# Patient Record
Sex: Female | Born: 1965 | Race: Black or African American | Hispanic: No | Smoking: Never smoker
Health system: Southern US, Community
[De-identification: ages and names within clinical notes are randomized; demographics above are authoritative.]

## PROBLEM LIST (undated history)

## (undated) DIAGNOSIS — E785 Hyperlipidemia, unspecified: Secondary | ICD-10-CM

## (undated) DIAGNOSIS — F419 Anxiety disorder, unspecified: Secondary | ICD-10-CM

## (undated) DIAGNOSIS — I10 Essential (primary) hypertension: Secondary | ICD-10-CM

## (undated) HISTORY — DX: Hyperlipidemia, unspecified: E78.5

## (undated) HISTORY — PX: TUBAL LIGATION: SHX77

## (undated) HISTORY — PX: ECTOPIC PREGNANCY SURGERY: SHX613

## (undated) HISTORY — DX: Essential (primary) hypertension: I10

## (undated) HISTORY — DX: Anxiety disorder, unspecified: F41.9

## (undated) HISTORY — PX: ENDOMETRIAL ABLATION: SHX621

---

## 2000-09-30 ENCOUNTER — Other Ambulatory Visit: Admission: RE | Admit: 2000-09-30 | Discharge: 2000-09-30 | Payer: Self-pay | Admitting: Obstetrics and Gynecology

## 2001-07-28 ENCOUNTER — Ambulatory Visit (HOSPITAL_COMMUNITY): Admission: RE | Admit: 2001-07-28 | Discharge: 2001-07-28 | Payer: Self-pay | Admitting: Obstetrics and Gynecology

## 2001-07-28 ENCOUNTER — Encounter: Payer: Self-pay | Admitting: Obstetrics and Gynecology

## 2004-08-23 ENCOUNTER — Ambulatory Visit (HOSPITAL_COMMUNITY): Admission: RE | Admit: 2004-08-23 | Discharge: 2004-08-23 | Payer: Self-pay | Admitting: Obstetrics and Gynecology

## 2006-12-27 ENCOUNTER — Ambulatory Visit (HOSPITAL_COMMUNITY): Admission: RE | Admit: 2006-12-27 | Discharge: 2006-12-27 | Payer: Self-pay | Admitting: Obstetrics and Gynecology

## 2007-02-13 ENCOUNTER — Ambulatory Visit (HOSPITAL_COMMUNITY): Admission: RE | Admit: 2007-02-13 | Discharge: 2007-02-13 | Payer: Self-pay | Admitting: Obstetrics and Gynecology

## 2008-03-08 ENCOUNTER — Other Ambulatory Visit: Admission: RE | Admit: 2008-03-08 | Discharge: 2008-03-08 | Payer: Self-pay | Admitting: Adult Health

## 2008-08-25 ENCOUNTER — Ambulatory Visit (HOSPITAL_COMMUNITY): Admission: RE | Admit: 2008-08-25 | Discharge: 2008-08-25 | Payer: Self-pay | Admitting: Family Medicine

## 2009-03-28 ENCOUNTER — Other Ambulatory Visit: Admission: RE | Admit: 2009-03-28 | Discharge: 2009-03-28 | Payer: Self-pay | Admitting: Obstetrics and Gynecology

## 2009-04-01 ENCOUNTER — Emergency Department (HOSPITAL_COMMUNITY): Admission: EM | Admit: 2009-04-01 | Discharge: 2009-04-01 | Payer: Self-pay | Admitting: Emergency Medicine

## 2009-04-01 ENCOUNTER — Ambulatory Visit (HOSPITAL_COMMUNITY): Admission: RE | Admit: 2009-04-01 | Discharge: 2009-04-01 | Payer: Self-pay | Admitting: Obstetrics & Gynecology

## 2010-08-30 ENCOUNTER — Other Ambulatory Visit (HOSPITAL_COMMUNITY)
Admission: RE | Admit: 2010-08-30 | Discharge: 2010-08-30 | Disposition: A | Discharge status: Home / Self Care | Payer: 59 | Source: Ambulatory Visit | Attending: Obstetrics and Gynecology | Admitting: Obstetrics and Gynecology

## 2010-08-30 DIAGNOSIS — Z01419 Encounter for gynecological examination (general) (routine) without abnormal findings: Secondary | ICD-10-CM | POA: Insufficient documentation

## 2010-10-10 NOTE — H&P (Signed)
NAME:  Shannon Ashley, Shannon Ashley                 ACCOUNT NO.:  [id number]   MEDICAL RECORD NO.:  [id number]          PATIENT TYPE:  AMB   LOCATION:  DAY                           FACILITY:  [facility]   PHYSICIAN:  Tilda Burrow, M.D. DATE OF BIRTH:  10/08/1965   DATE OF ADMISSION:  02/13/2007  DATE OF DISCHARGE:  LH                              HISTORY & PHYSICAL   ADMISSION DIAGNOSES:  1. Desire for elective permanent sterilization.  2. Menorrhagia and dysmenorrhea.   HISTORY OF PRESENT ILLNESS:  This 45 year old female is admitted at this  time for elective permanent sterilization and endometrial ablation to  address her desire to have permanent sterilization performed so she can  get off of her birth control pills.  She IUD removed Sep 26, 2006, and  was placed on Loestrin 24 and is on her 3rd pack.  She has been having  cramps.  Her periods are light but somewhat irregular for the past three  to four months.  She was taken off the birth control pills in July due  to some question about headache and chest discomfort of a nonspecific  nature which had a negative workup but led to concerns.  She desires no  further childbearing.  Additionally, the menstrual discomforts have been  sufficient enough that she desires endometrial ablation to be performed  at the time of tubal sterilization.  She has had review of the procedure  by Cyril Mourning, nurse practitioner in our office, and gas received  the company's ablation brochures and questions were answered to the  patient's satisfaction.   PAST MEDICAL HISTORY:  1. Elevated lipids.  2. Mild hypertension.  3. Recently, she has had evaluation for left ovarian cyst.  4. She was described by a recent urologist as having a      cystoenterocele, but with her exam today seems to have adequate      pelvic support at the present time.  5. She has had multiple normal Pap smears, the most recently normal      09/2006.   PHYSICAL EXAMINATION:  VITAL  SIGNS:  Height 5 feet 6 inches, weight 210,  blood pressure 120/80.  HEENT:  Pupils equal and reactive.  NECK:  Supple.  CARDIOVASCULAR:  Unremarkable.  ABDOMEN:  Nontender.  EXTERNAL GENITALIA:  Multiparous.  VAGINAL EXAM:  Normal secretions.  Normal supported cervix.  Uterus  retroverted, retroflexed.  Adnexa nontender without masses.   ASSESSMENT:  1. Elective sterilization.  2. Menorrhagia.   PLAN:  Laparoscopic tubal sterilization with Falope rings at 9 o'clock  on September 18, with hysteroscopy D&C and endometrial ablation under  the same anesthesia.      Tilda Burrow, M.D.  Electronically Signed     JVF/MEDQ  D:  02/05/2007  T:  02/06/2007  Job:  [id number]   cc:   [facility]

## 2010-10-10 NOTE — Op Note (Signed)
NAME:  Shannon Ashley, Shannon Ashley                 ACCOUNT NO.:  [id number]   MEDICAL RECORD NO.:  [id number]          PATIENT TYPE:  AMB   LOCATION:  DAY                           FACILITY:  [facility]   PHYSICIAN:  Tilda Burrow, M.D. DATE OF BIRTH:  03-08-1966   DATE OF PROCEDURE:  02/13/2007  DATE OF DISCHARGE:                               OPERATIVE REPORT   PREOPERATIVE DIAGNOSES:  1. Menorrhagia.  2. Elective sterilization.   POSTOPERATIVE DIAGNOSES:  1. Menorrhagia.  2. Elective sterilization.   PROCEDURE:  1. Laparoscopic tubal sterilization Falope ring application.  2. Hysteroscopy, D&C, endometrial ablation.   SURGEON:  Dr. Emelda Fear   ASSISTANT:  None.   ANESTHESIA:  General.   COMPLICATIONS:  None.   FINDINGS:  Normal uterus, tubes, and ovaries.  Small area of ascending  colon adhesions to anterior abdominal wall, longstanding and left alone.  Retroverted, retroflexed uterus.   DETAILS OF PROCEDURE:  The patient was taken to the operating room,  prepped and  draped for a vaginal and abdominal combined procedure.  An  infraumbilical vertical skin incision was made as well as a suprapubic  incision. Veress needle was used to introduce pneumoperitoneum under 9  mmHg pressure. Laparoscopic trocar was introduced directly, and  inspection of the abdomen revealed no evidence of trauma to the internal  organs.  The bladder was full but not an obstacle to progress.  Uterus  could be anteflexed with the Hulka manipulator which was in place in the  uterus, and each tube could be grasped, elevated, and Falope ring  applied to the mid segment knuckle of each tube.  The tube was then  infiltrated with Marcaine solution to achieve long-term analgesia during  the necrosis process.  Inspection of the upper abdomen showed some  adhesions to the right lower quadrant, but these did appear to be under  tension and were left alone.  The appendiceal tip could be visualized  and appeared normal.   The abdomen was deflated, saline solution instilled, the laparoscopic  equipment removed.  Subcuticular 3-0 Vicryl used to close the skin  incisions and Steri-Strips and Band-Aids placed over the abdominal  incisions.  Sponge and needle counts correct.   Hysteroscopy, D&C, endometrial ablation followed.   The speculum was inserted, cervix grasped with single-tooth tenaculum,  and the uterus sounded to 8 cm in the retroverted, retroflexed position  and the uterus dilated to 23 [place], allowing introduction of a rigid 30-  degree hysteroscope to identify the uterine contours.  There was a  minimal amount of shaggy debris inside the uterus, and this was  curetted.  There was not enough removable to warrant specimen.  No  pathologic abnormalities were suspected.  Tubal ostia were visualized.  The Gynecare ThermaChoice 3 endometrial ablation device was inserted,  instilled with 8 mL with saline in order to achieve pressures greater  than 150 mmHg whereupon the 8 minute thermal ablation sequence was  initiated and completed with the temperature of 87 degrees CGy achieved  and pressure maintained between 150 and 165 mmHg.  All the saline was  removed at the completion of the ablation sequence and hysteroscopy  repeated to confirm satisfactory thermal changes in the endometrial  cavity.  Photos 7 and 8 show the postablation appearance of the  endometrium in the lower uterine segment.  The patient then had Marcaine  paracervical block applied x10 mL on each side, and then bladder was  catheterized removing 175 mL urine.  The patient went to recovery room  with sponge and needle counts correct.      Tilda Burrow, M.D.  Electronically Signed     JVF/MEDQ  D:  02/13/2007  T:  02/13/2007  Job:  [id number]

## 2011-03-08 LAB — COMPREHENSIVE METABOLIC PANEL
ALT: 22
Albumin: 3.5
Alkaline Phosphatase: 50
Potassium: 4.3
Sodium: 137
Total Protein: 6.6

## 2011-03-08 LAB — CBC
Platelets: 329
RDW: 12.6

## 2011-10-24 ENCOUNTER — Other Ambulatory Visit (HOSPITAL_COMMUNITY)
Admission: RE | Admit: 2011-10-24 | Discharge: 2011-10-24 | Disposition: A | Discharge status: Home / Self Care | Payer: 59 | Source: Ambulatory Visit | Attending: Obstetrics and Gynecology | Admitting: Obstetrics and Gynecology

## 2011-10-24 DIAGNOSIS — Z01419 Encounter for gynecological examination (general) (routine) without abnormal findings: Secondary | ICD-10-CM | POA: Insufficient documentation

## 2011-10-24 DIAGNOSIS — Z1159 Encounter for screening for other viral diseases: Secondary | ICD-10-CM | POA: Insufficient documentation

## 2011-10-24 DIAGNOSIS — Z113 Encounter for screening for infections with a predominantly sexual mode of transmission: Secondary | ICD-10-CM | POA: Insufficient documentation

## 2012-08-27 ENCOUNTER — Other Ambulatory Visit: Payer: Self-pay | Admitting: Obstetrics & Gynecology

## 2012-08-27 MED ORDER — ESCITALOPRAM OXALATE 10 MG PO TABS: 10.0000 mg | ORAL_TABLET | Freq: Every day | ORAL | Status: DC

## 2012-10-22 ENCOUNTER — Other Ambulatory Visit: Payer: Self-pay | Admitting: Adult Health

## 2013-06-17 ENCOUNTER — Other Ambulatory Visit: Payer: Self-pay | Admitting: Adult Health

## 2013-06-17 DIAGNOSIS — Z139 Encounter for screening, unspecified: Secondary | ICD-10-CM

## 2013-06-23 ENCOUNTER — Ambulatory Visit (HOSPITAL_COMMUNITY)
Admission: RE | Admit: 2013-06-23 | Discharge: 2013-06-23 | Disposition: A | Discharge status: Home / Self Care | Payer: 59 | Source: Ambulatory Visit | Attending: Adult Health | Admitting: Adult Health

## 2013-06-23 DIAGNOSIS — Z139 Encounter for screening, unspecified: Secondary | ICD-10-CM

## 2013-06-23 DIAGNOSIS — Z1231 Encounter for screening mammogram for malignant neoplasm of breast: Secondary | ICD-10-CM | POA: Insufficient documentation

## 2013-07-07 ENCOUNTER — Other Ambulatory Visit (HOSPITAL_COMMUNITY)
Admission: RE | Admit: 2013-07-07 | Discharge: 2013-07-07 | Disposition: A | Discharge status: Home / Self Care | Payer: 59 | Source: Ambulatory Visit | Attending: Adult Health | Admitting: Adult Health

## 2013-07-07 ENCOUNTER — Encounter: Payer: Self-pay | Admitting: Adult Health

## 2013-07-07 ENCOUNTER — Encounter (INDEPENDENT_AMBULATORY_CARE_PROVIDER_SITE_OTHER): Payer: Self-pay

## 2013-07-07 ENCOUNTER — Ambulatory Visit (INDEPENDENT_AMBULATORY_CARE_PROVIDER_SITE_OTHER): Payer: 59 | Admitting: Adult Health

## 2013-07-07 VITALS — BP 120/80 | HR 78 | Ht 67.0 in | Wt 199.0 lb

## 2013-07-07 DIAGNOSIS — F419 Anxiety disorder, unspecified: Secondary | ICD-10-CM | POA: Insufficient documentation

## 2013-07-07 DIAGNOSIS — E7800 Pure hypercholesterolemia, unspecified: Secondary | ICD-10-CM | POA: Insufficient documentation

## 2013-07-07 DIAGNOSIS — Z1212 Encounter for screening for malignant neoplasm of rectum: Secondary | ICD-10-CM

## 2013-07-07 DIAGNOSIS — Z01419 Encounter for gynecological examination (general) (routine) without abnormal findings: Secondary | ICD-10-CM | POA: Insufficient documentation

## 2013-07-07 DIAGNOSIS — Z113 Encounter for screening for infections with a predominantly sexual mode of transmission: Secondary | ICD-10-CM | POA: Insufficient documentation

## 2013-07-07 DIAGNOSIS — Z1151 Encounter for screening for human papillomavirus (HPV): Secondary | ICD-10-CM | POA: Insufficient documentation

## 2013-07-07 DIAGNOSIS — I10 Essential (primary) hypertension: Secondary | ICD-10-CM

## 2013-07-07 LAB — HEMOCCULT GUIAC POC 1CARD (OFFICE): FECAL OCCULT BLD: NEGATIVE

## 2013-07-07 LAB — TSH: TSH: 1.296 u[IU]/mL (ref 0.350–4.500)

## 2013-07-07 LAB — CBC
HEMATOCRIT: 37.4 % (ref 36.0–46.0)
HEMOGLOBIN: 12.9 g/dL (ref 12.0–15.0)
MCH: 32.1 pg (ref 26.0–34.0)
MCHC: 34.5 g/dL (ref 30.0–36.0)
MCV: 93 fL (ref 78.0–100.0)
Platelets: 333 10*3/uL (ref 150–400)
RBC: 4.02 MIL/uL (ref 3.87–5.11)
RDW: 13.1 % (ref 11.5–15.5)
WBC: 7.3 10*3/uL (ref 4.0–10.5)

## 2013-07-07 LAB — COMPREHENSIVE METABOLIC PANEL
ALBUMIN: 4.2 g/dL (ref 3.5–5.2)
ALK PHOS: 40 U/L (ref 39–117)
ALT: 20 U/L (ref 0–35)
AST: 17 U/L (ref 0–37)
BUN: 15 mg/dL (ref 6–23)
CALCIUM: 9 mg/dL (ref 8.4–10.5)
CHLORIDE: 105 meq/L (ref 96–112)
CO2: 26 mEq/L (ref 19–32)
CREATININE: 0.72 mg/dL (ref 0.50–1.10)
GLUCOSE: 83 mg/dL (ref 70–99)
POTASSIUM: 4.2 meq/L (ref 3.5–5.3)
Sodium: 138 mEq/L (ref 135–145)
Total Bilirubin: 0.3 mg/dL (ref 0.2–1.2)
Total Protein: 7.2 g/dL (ref 6.0–8.3)

## 2013-07-07 LAB — LIPID PANEL
CHOLESTEROL: 166 mg/dL (ref 0–200)
HDL: 48 mg/dL (ref >39)
LDL Cholesterol: 107 mg/dL — ABNORMAL HIGH (ref 0–99)
TRIGLYCERIDES: 56 mg/dL (ref <150)
Total CHOL/HDL Ratio: 3.5 Ratio
VLDL: 11 mg/dL (ref 0–40)

## 2013-07-07 MED ORDER — SIMVASTATIN 10 MG PO TABS: 10.0000 mg | ORAL_TABLET | Freq: Every day | ORAL | Status: DC

## 2013-07-07 MED ORDER — SIMVASTATIN 20 MG PO TABS: 20.0000 mg | ORAL_TABLET | Freq: Every day | ORAL | Status: DC

## 2013-07-07 MED ORDER — ALPRAZOLAM 0.5 MG PO TABS: 0.5000 mg | ORAL_TABLET | Freq: Every evening | ORAL | Status: DC | PRN

## 2013-07-07 NOTE — Progress Notes (Addendum)
Patient ID: Shannon Ashley, female   DOB: 1966/02/19, 48 y.o.   MRN: [id number] History of Present Illness: Shannon Ashley is a 48 year old black female separated in for a pap and physical.She had pap with negative HPV in 2013.   Current Medications, Allergies, Past Medical History, Past Surgical History, Family History and Social History were reviewed in [employer] record.     Review of Systems: Patient denies any headaches, blurred vision, shortness of breath, chest pain, abdominal pain, problems with bowel movements, urination, or intercourse(not having sex at present). No joint pain or mood swings, but not sleeping well.    Physical Exam:BP 120/80  Pulse 78  Ht 5\' 7"  (1.702 m)  Wt 199 lb (90.266 kg)  BMI 31.16 kg/m2 General:  Well developed, well nourished, no acute distress Skin:  Warm and dry Neck:  Midline trachea, normal thyroid Lungs; Clear to auscultation bilaterally Breast:  No dominant palpable mass, retraction, or nipple discharge Cardiovascular: Regular rate and rhythm Abdomen:  Soft, non tender, no hepatosplenomegaly Pelvic:  External genitalia is normal in appearance.  The vagina is normal in appearance, pinkish discharge. The cervix is bulbous. Pap with GC/CHL done. Uterus is felt to be normal size, shape, and contour.  No adnexal masses or tenderness noted. Rectal: Good sphincter tone, no polyps, or hemorrhoids felt.  Hemoccult negative. Extremities:  No swelling or varicosities noted Psych:  Alert and cooperative, seems happy, but stressed is not sleeping well   Impression: Yearly gyn exam Anxiety Hypertension Elevated cholesterol    Plan: Check CBC,CMP,TSH and lipids Xanax 0.5 mg #30 1 at hs prn no refills  Refilled zocor 20 mg x 1 year Has refills on lexapro and HCTZ Physical in 1 year Mammogram yearly  Colonoscopy at 74

## 2013-07-07 NOTE — Patient Instructions (Signed)
Physical in 1 year Mammogram yearly  Will call with labs Continue meds Generalized Anxiety Disorder Generalized anxiety disorder (GAD) is a mental disorder. It interferes with life functions, including relationships, work, and school. GAD is different from normal anxiety, which everyone experiences at some point in their lives in response to specific life events and activities. Normal anxiety actually helps [place] prepare for and get through these life events and activities. Normal anxiety goes away after the event or activity is over.  GAD causes anxiety that is not necessarily related to specific events or activities. It also causes excess anxiety in proportion to specific events or activities. The anxiety associated with GAD is also difficult to control. GAD can vary from mild to severe. People with severe GAD can have intense waves of anxiety with physical symptoms (panic attacks).  SYMPTOMS The anxiety and worry associated with GAD are difficult to control. This anxiety and worry are related to many life events and activities and also occur more days than not for 6 months or longer. People with GAD also have three or more of the following symptoms (one or more in children):  Restlessness.   Fatigue.  Difficulty concentrating.   Irritability.  Muscle tension.  Difficulty sleeping or unsatisfying sleep. DIAGNOSIS GAD is diagnosed through an assessment by your caregiver. Your caregiver will ask you questions aboutyour mood,physical symptoms, and events in your life. Your caregiver may ask you about your medical history and use of alcohol or drugs, including prescription medications. Your caregiver may also do a physical exam and blood tests. Certain medical conditions and the use of certain substances can cause symptoms similar to those associated with GAD. Your caregiver may refer you to a mental health specialist for further evaluation. TREATMENT The following therapies are usually used  to treat GAD:   Medication Antidepressant medication usually is prescribed for long-term daily control. Antianxiety medications may be added in severe cases, especially when panic attacks occur.   Talk therapy (psychotherapy) Certain types of talk therapy can be helpful in treating GAD by providing support, education, and guidance. A form of talk therapy called cognitive behavioral therapy can teach you healthy ways to think about and react to daily life events and activities.  Stress managementtechniques These include yoga, meditation, and exercise and can be very helpful when they are practiced regularly. A mental health specialist can help determine which treatment is best for you. Some people see improvement with one therapy. However, other people require a combination of therapies. Document Released: 09/08/2012 Document Reviewed: 09/08/2012 [facility] Patient Information 2014 [place], [place].

## 2013-07-07 NOTE — Addendum Note (Signed)
Addended by: Derrek Monaco A on: 07/07/2013 02:10 PM   Modules accepted: Orders

## 2013-07-08 ENCOUNTER — Telehealth: Payer: Self-pay | Admitting: Adult Health

## 2013-07-08 NOTE — Telephone Encounter (Signed)
Pt aware of labs  

## 2013-08-21 ENCOUNTER — Other Ambulatory Visit: Payer: Self-pay | Admitting: Adult Health

## 2013-08-24 ENCOUNTER — Other Ambulatory Visit: Payer: Self-pay | Admitting: Women's Health

## 2013-10-10 ENCOUNTER — Other Ambulatory Visit: Payer: Self-pay | Admitting: Adult Health

## 2013-10-10 ENCOUNTER — Other Ambulatory Visit: Payer: Self-pay | Admitting: Obstetrics & Gynecology

## 2013-10-22 ENCOUNTER — Other Ambulatory Visit: Payer: Self-pay | Admitting: Adult Health

## 2014-01-10 ENCOUNTER — Other Ambulatory Visit: Payer: Self-pay | Admitting: Adult Health

## 2014-03-29 ENCOUNTER — Encounter: Payer: Self-pay | Admitting: Adult Health

## 2014-05-15 ENCOUNTER — Other Ambulatory Visit: Payer: Self-pay | Admitting: Adult Health

## 2014-06-07 ENCOUNTER — Ambulatory Visit (INDEPENDENT_AMBULATORY_CARE_PROVIDER_SITE_OTHER): Payer: BLUE CROSS/BLUE SHIELD | Admitting: Family Medicine

## 2014-06-07 ENCOUNTER — Encounter: Payer: Self-pay | Admitting: Family Medicine

## 2014-06-07 VITALS — BP 154/90 | Temp 99.1°F | Ht 67.0 in | Wt 193.0 lb

## 2014-06-07 DIAGNOSIS — J111 Influenza due to unidentified influenza virus with other respiratory manifestations: Secondary | ICD-10-CM

## 2014-06-07 DIAGNOSIS — J1189 Influenza due to unidentified influenza virus with other manifestations: Secondary | ICD-10-CM

## 2014-06-07 MED ORDER — BENZONATATE 100 MG PO CAPS: 100.0000 mg | ORAL_CAPSULE | Freq: Four times a day (QID) | ORAL | Status: DC | PRN

## 2014-06-07 MED ORDER — OSELTAMIVIR PHOSPHATE 75 MG PO CAPS: 75.0000 mg | ORAL_CAPSULE | Freq: Two times a day (BID) | ORAL | Status: DC

## 2014-06-07 NOTE — Progress Notes (Signed)
   Subjective:    Patient ID: Shannon Ashley, female    DOB: April 25, 1966, 49 y.o.   MRN: [id number]  Cough This is a new problem. The current episode started yesterday. Associated symptoms include chills, a fever and headaches. Treatments tried: tylenol, sudafed.   Energy level zero  Appetite zero  Tickle cough, occas productive  tmax no measured but def fevr  Sudafed and tylenol      Review of Systems  Constitutional: Positive for fever and chills.  Respiratory: Positive for cough.   Neurological: Positive for headaches.       Objective:   Physical Exam Alert moderate malaise. Hydration decent. Vitals reviewed. HEENT nasal congestion. Fractional neck supple. Lungs clear. Heart regular in rhythm.       Assessment & Plan:  Impression acute influenza plan Tamiflu twice a day 5 days. Tessalon when necessary. Symptomatic care discussed. WSL

## 2014-08-27 ENCOUNTER — Encounter: Payer: Self-pay | Admitting: Nurse Practitioner

## 2014-08-27 ENCOUNTER — Ambulatory Visit (INDEPENDENT_AMBULATORY_CARE_PROVIDER_SITE_OTHER): Payer: BLUE CROSS/BLUE SHIELD | Admitting: Nurse Practitioner

## 2014-08-27 VITALS — BP 148/100 | Temp 98.6°F | Ht 66.75 in | Wt 198.2 lb

## 2014-08-27 DIAGNOSIS — F419 Anxiety disorder, unspecified: Secondary | ICD-10-CM

## 2014-08-27 DIAGNOSIS — K589 Irritable bowel syndrome without diarrhea: Secondary | ICD-10-CM

## 2014-08-27 DIAGNOSIS — E785 Hyperlipidemia, unspecified: Secondary | ICD-10-CM | POA: Diagnosis not present

## 2014-08-27 DIAGNOSIS — I10 Essential (primary) hypertension: Secondary | ICD-10-CM | POA: Diagnosis not present

## 2014-08-27 DIAGNOSIS — K921 Melena: Secondary | ICD-10-CM | POA: Diagnosis not present

## 2014-08-27 DIAGNOSIS — R1033 Periumbilical pain: Secondary | ICD-10-CM | POA: Diagnosis not present

## 2014-08-27 DIAGNOSIS — R5383 Other fatigue: Secondary | ICD-10-CM | POA: Diagnosis not present

## 2014-08-27 MED ORDER — SIMVASTATIN 20 MG PO TABS: 20.0000 mg | ORAL_TABLET | Freq: Every day | ORAL | Status: DC

## 2014-08-27 MED ORDER — ESCITALOPRAM OXALATE 20 MG PO TABS
20.0000 mg | ORAL_TABLET | Freq: Every day | ORAL | Status: DC
Start: 2014-08-27 — End: 2015-02-02

## 2014-08-27 MED ORDER — ALPRAZOLAM 0.5 MG PO TABS: ORAL_TABLET | ORAL | Status: DC

## 2014-08-27 NOTE — Patient Instructions (Signed)
Diet and Irritable Bowel Syndrome  No cure has been found for irritable bowel syndrome (IBS). Many options are available to treat the symptoms. Your caregiver will give you the best treatments available for your symptoms. He or she will also encourage you to manage stress and to make changes to your diet. You need to work with your caregiver and Registered Dietician to find the best combination of medicine, diet, counseling, and support to control your symptoms. The following are some diet suggestions. FOODS THAT MAKE IBS WORSE  Fatty foods, such as [place] fries.  Milk products, such as cheese or ice cream.  Chocolate.  Alcohol.  Caffeine (found in coffee and some sodas).  Carbonated drinks, such as soda. If certain foods cause symptoms, you should eat less of them or stop eating them. FOOD JOURNAL   Keep a journal of the foods that seem to cause distress. Write down:  What you are eating during the day and when.  What problems you are having after eating.  When the symptoms occur in relation to your meals.  What foods always make you feel badly.  Take your notes with you to your caregiver to see if you should stop eating certain foods. FOODS THAT MAKE IBS BETTER Fiber reduces IBS symptoms, especially constipation, because it makes stools soft, bulky, and easier to pass. Fiber is found in bran, bread, cereal, beans, fruit, and vegetables. Examples of foods with fiber include:  Apples.  Peaches.  Pears.  Berries.  Figs.  Broccoli, raw.  Cabbage.  Carrots.  Raw peas.  Kidney beans.  Lima beans.  Whole-grain bread.  Whole-grain cereal. Add foods with fiber to your diet a little at a time. This will let your body get used to them. Too much fiber at once might cause gas and swelling of your abdomen. This can trigger symptoms in a person with IBS. Caregivers usually recommend a diet with enough fiber to produce soft, painless bowel movements. High fiber diets may  cause gas and bloating. However, these symptoms often go away within a few weeks, as your body adjusts. In many cases, dietary fiber may lessen IBS symptoms, particularly constipation. However, it may not help pain or diarrhea. High fiber diets keep the colon mildly enlarged (distended) with the added fiber. This may help prevent spasms in the colon. Some forms of fiber also keep water in the stool, thereby preventing hard stools that are difficult to pass.  Besides telling you to eat more foods with fiber, your caregiver may also tell you to get more fiber by taking a fiber pill or drinking water mixed with a special high fiber powder. An example of this is a natural fiber laxative containing psyllium seed.  TIPS  Large meals can cause cramping and diarrhea in people with IBS. If this happens to you, try eating 4 or 5 small meals a day, or try eating less at each of your usual 3 meals. It may also help if your meals are low in fat and high in carbohydrates. Examples of carbohydrates are pasta, rice, whole-grain breads and cereals, fruits, and vegetables.  If dairy products cause your symptoms to flare up, you can try eating less of those foods. You might be able to handle yogurt better than other dairy products, because it contains bacteria that helps with digestion. Dairy products are an important source of calcium and other nutrients. If you need to avoid dairy products, be sure to talk with a Registered Dietitian about getting these nutrients  through other food sources.  Drink enough water and fluids to keep your urine clear or pale yellow. This is important, especially if you have diarrhea. FOR MORE INFORMATION  International Foundation for Functional Gastrointestinal Disorders: www.iffgd.org  National Digestive Diseases Information Clearinghouse: digestive.AmenCredit.is Document Released: 08/04/2003 Document Revised: 08/06/2011 Document Reviewed: 08/14/2013 [facility] Patient Information 2015  [place], [place]. This information is not intended to replace advice given to you by your health care provider. Make sure you discuss any questions you have with your health care provider.

## 2014-08-31 ENCOUNTER — Encounter: Payer: Self-pay | Admitting: Nurse Practitioner

## 2014-08-31 DIAGNOSIS — E785 Hyperlipidemia, unspecified: Secondary | ICD-10-CM | POA: Insufficient documentation

## 2014-08-31 NOTE — Progress Notes (Signed)
Subjective:  Presents for c/o slightly loose stools which have improved with use of probiotics. Some urgency. Minimal rectal pain. Feels like bowels do not empty completely. No fevers. Occasional blood after wiping that began about 7-8 months ago. No blood in toilet. Gregory: her father has had benign polyps. Occasional pain mid abdomen just above umbilical area. Occasional heartburn when she eats greasy foods or eats too late. Had endometrial ablation in 2010; just had her first menses following this. Has been under stress lately. Lexapro 10 mg does not seem to work as well. Increased fatigue.   Objective:   BP 148/100 mmHg  Temp(Src) 98.6 F (37 C) (Oral)  Ht 5' 6.75" (1.695 m)  Wt 198 lb 3.2 oz (89.903 kg)  BMI 31.29 kg/m2 NAD. Alert, oriented. Lungs clear. Heart RRR. Abdomen soft, non distended with active BS x 4. Minimal tenderness mid abdomen just above umbilicus. No masses, rebound or guarding. Rectal area; very small external hemorrhoid no active bleeding. Rectal exam: no masses; no stool for hemoccult.   Assessment:  Problem List Items Addressed This Visit      Cardiovascular and Mediastinum   Hypertension   Relevant Medications   simvastatin (ZOCOR) tablet   Other Relevant Orders   Basic metabolic panel     Other   Anxiety   Relevant Medications   ALPRAZolam (XANAX) tablet   escitalopram (LEXAPRO) tablet   Hyperlipidemia   Relevant Medications   simvastatin (ZOCOR) tablet   Other Relevant Orders   Lipid panel    Other Visit Diagnoses    Hematochezia    -  Primary    Relevant Orders    Ambulatory referral to Gastroenterology    CBC with Differential/Platelet    Periumbilical abdominal pain        Relevant Orders    Ambulatory referral to Gastroenterology    Irritable bowel syndrome        Relevant Orders    Ambulatory referral to Gastroenterology    Other fatigue        Relevant Orders    TSH    Vit D  25 hydroxy (rtn osteoporosis monitoring)    CBC with  Differential/Platelet         Plan:  Meds ordered this encounter  Medications  . ALPRAZolam (XANAX) 0.5 MG tablet    Sig: TAKE ONE TABLET BY MOUTH AT BEDTIME AS NEEDED FOR ANXIETY    Dispense:  30 tablet    Refill:  0    Order Specific Question:  Supervising Provider    Answer:  Mikey Kirschner [[id number]]  . simvastatin (ZOCOR) 20 MG tablet    Sig: Take 1 tablet (20 mg total) by mouth daily.    Dispense:  30 tablet    Refill:  11    Order Specific Question:  Supervising Provider    Answer:  Mikey Kirschner [[id number]]  . escitalopram (LEXAPRO) 20 MG tablet    Sig: Take 1 tablet (20 mg total) by mouth daily.    Dispense:  30 tablet    Refill:  2    Order Specific Question:  Supervising Provider    Answer:  Mikey Kirschner [[id number]]   Increase Lexapro to 20 mg. Discussed importance of stress reduction. Wants to avoid adding BP med. Discussed factors affecting BP. Labs pending. Warning signs reviewed. Return in about 3 months (around 11/26/2014). Call back sooner if any problems.

## 2014-09-01 ENCOUNTER — Encounter: Payer: Self-pay | Admitting: Internal Medicine

## 2014-09-14 ENCOUNTER — Other Ambulatory Visit: Payer: Self-pay | Admitting: Dermatology

## 2014-09-21 ENCOUNTER — Other Ambulatory Visit: Payer: Self-pay

## 2014-09-21 ENCOUNTER — Ambulatory Visit (INDEPENDENT_AMBULATORY_CARE_PROVIDER_SITE_OTHER): Payer: BLUE CROSS/BLUE SHIELD | Admitting: Nurse Practitioner

## 2014-09-21 ENCOUNTER — Encounter: Payer: Self-pay | Admitting: Nurse Practitioner

## 2014-09-21 VITALS — BP 145/95 | HR 69 | Temp 97.4°F | Ht 66.0 in | Wt 198.0 lb

## 2014-09-21 DIAGNOSIS — K649 Unspecified hemorrhoids: Secondary | ICD-10-CM | POA: Diagnosis not present

## 2014-09-21 DIAGNOSIS — K625 Hemorrhage of anus and rectum: Secondary | ICD-10-CM | POA: Diagnosis not present

## 2014-09-21 DIAGNOSIS — R1033 Periumbilical pain: Secondary | ICD-10-CM | POA: Diagnosis not present

## 2014-09-21 DIAGNOSIS — R194 Change in bowel habit: Secondary | ICD-10-CM | POA: Diagnosis not present

## 2014-09-21 DIAGNOSIS — R1030 Lower abdominal pain, unspecified: Secondary | ICD-10-CM

## 2014-09-21 MED ORDER — HYDROCORTISONE ACETATE 25 MG RE SUPP: 25.0000 mg | Freq: Two times a day (BID) | RECTAL | Status: DC | PRN

## 2014-09-21 MED ORDER — PEG 3350-KCL-NA BICARB-NACL 420 G PO SOLR: 4000.0000 mL | ORAL | Status: DC

## 2014-09-21 NOTE — Progress Notes (Signed)
Primary Care Physician:  Mickie Hillier, MD Primary Gastroenterologist:  Dr. Gala Romney  Chief Complaint  Patient presents with  . Abdominal Pain  . Constipation    HPI:   49 year old female presents on referral from PCP for hematochezia and bowel changes. PCP notes reviewed and indicates patient began having abdominal pain, rectal pain, sensation of incomplete emptying, and toilet tissue hematochezia which started about 7-8 months ago. Pertinent family history includes father with benign colonic polyps. Patient is African-American, has never had a colonoscopy, and current screening guidelines indicate initial colonoscopy for African-Americans a 49 years old.  Today she states she did begin having issues 7-8 months ago. Confirms the above including incomplete emptying, sometimes diarrhea and sometimes constipation. Probiotic yogurt has helped. Occasionally has fecal urgency with the diarrhea. Is also having occasional toilet tissue hematochezia. Hematochezia noted both with constipation and with diarrhea. Has a bowel movement usually every day. Denies melena. Also having occasional/rate dyspepsia, typically has an episode once a week. Symptoms are relieved by TUMS and she states she's ok with this at this time. Abdominal pain located in the epigastric and lower abdomen, occurs 2-3 times a week, is sometimes relieved by a bowel movement. Denies fever, chills, unintentional weight loss, vomiting. Does have occasional nausea. Denies chest pain, dyspnea, lightheadedness, dizziness, syncope, and near syncope. Denies additional upper or lower GI symptoms.  Past Medical History  Diagnosis Date  . Hypertension   . Hyperlipidemia   . Anxiety     Past Surgical History  Procedure Laterality Date  . Tubal ligation    . Endometrial ablation    . Ectopic pregnancy surgery      Current Outpatient Prescriptions  Medication Sig Dispense Refill  . ALPRAZolam (XANAX) 0.5 MG tablet TAKE ONE TABLET BY MOUTH  AT BEDTIME AS NEEDED FOR ANXIETY 30 tablet 0  . cetirizine (ZYRTEC) 10 MG tablet Take 10 mg by mouth as needed for allergies.    Marland Kitchen escitalopram (LEXAPRO) 20 MG tablet Take 1 tablet (20 mg total) by mouth daily. 30 tablet 2  . hydrochlorothiazide (MICROZIDE) 12.5 MG capsule TAKE ONE CAPSULE BY MOUTH DAILY 30 capsule 11  . simvastatin (ZOCOR) 20 MG tablet Take 1 tablet (20 mg total) by mouth daily. 30 tablet 11   No current facility-administered medications for this visit.    Allergies as of 09/21/2014  . (No Known Allergies)    Family History  Problem Relation Age of Onset  . Cancer Mother     lung  . Hypertension Mother   . Stroke Father   . Heart attack Father   . Congestive Heart Failure Father   . Kidney failure Father   . Colon polyps Father   . Hypertension Sister   . Drug abuse Sister   . Diabetes Maternal Uncle   . Early death Paternal 2   . Asthma Paternal Aunt   . Cancer Maternal Grandmother     lung  . Congestive Heart Failure Paternal Grandmother   . Cancer Paternal Grandmother     pancreatic    History   Social History  . Marital Status: Legally Separated    Spouse Name: N/A  . Number of Children: N/A  . Years of Education: N/A   Occupational History  . Not on file.   Social History Main Topics  . Smoking status: Never Smoker   . Smokeless tobacco: Never Used  . Alcohol Use: 2.4 oz/week    4 Glasses of wine per week  . Drug  Use: No  . Sexual Activity: No   Other Topics Concern  . Not on file   Social History Narrative    Review of Systems: General: Negative for anorexia, weight loss, fever, chills, fatigue, weakness. Eyes: Negative for vision changes.  ENT: Negative for hoarseness, difficulty swallowing. CV: Negative for chest pain, angina, palpitations, peripheral edema.  Respiratory: Negative for dyspnea at rest, cough, wheezing.  GI: See history of present illness. MS: Negative for joint pain, low back pain.  Derm: Negative for  rash or itching.  Neuro: Negative for weakness, memory loss, confusion.  Psych: Negative for anxiety, depression.  Endo: Negative for unusual weight change.  Heme: Negative for bruising or bleeding. Allergy: Negative for rash or hives.    Physical Exam: BP 145/95 mmHg  Pulse 69  Temp(Src) 97.4 F (36.3 C)  Ht 5\' 6"  (1.676 m)  Wt 198 lb (89.812 kg)  BMI 31.97 kg/m2  LMP 09/20/2014 General:   Alert and oriented. Pleasant and cooperative. Well-nourished and well-developed.  Head:  Normocephalic and atraumatic. Eyes:  Without icterus, sclera clear and conjunctiva pink.  Ears:  Normal auditory acuity. Mouth:  No deformity or lesions, oral mucosa pink. No OP edema. Neck:  Supple, without mass or thyromegaly. Lungs:  Clear to auscultation bilaterally. No wheezes, rales, or rhonchi. No distress.  Heart:  S1, S2 present without murmurs appreciated.  Abdomen:  +BS, soft, and non-distended. Mild to moderate TTP lower abdomen. No HSM noted. No guarding or rebound. No masses appreciated.  Rectal:  Deferred  Msk:  Symmetrical without gross deformities. Normal posture. Pulses:  Normal DP pulses noted. Extremities:  Without clubbing or edema. Neurologic:  Alert and  oriented x4;  grossly normal neurologically. Skin:  Intact without significant lesions or rashes. Psych:  Alert and cooperative. Normal mood and affect.     09/21/2014 8:30 AM

## 2014-09-21 NOTE — Patient Instructions (Signed)
1. We will schedule your procedure (colonoscopy) for you. 2. Try to have your labs drawn next several days. 3. I send in a prescription for suppositories for you to help with her symptoms while we wait for the procedure. 4. Follow-up in 3 months for reevaluation of your symptoms.

## 2014-09-22 LAB — LIPID PANEL
CHOL/HDL RATIO: 3.6 ratio (ref 0.0–4.4)
Cholesterol, Total: 196 mg/dL (ref 100–199)
HDL: 55 mg/dL (ref >39)
LDL Calculated: 124 mg/dL — ABNORMAL HIGH (ref 0–99)
TRIGLYCERIDES: 87 mg/dL (ref 0–149)
VLDL CHOLESTEROL CAL: 17 mg/dL (ref 5–40)

## 2014-09-22 LAB — BASIC METABOLIC PANEL
BUN/Creatinine Ratio: 14 (ref 9–23)
BUN: 12 mg/dL (ref 6–24)
CO2: 22 mmol/L (ref 18–29)
CREATININE: 0.85 mg/dL (ref 0.57–1.00)
Calcium: 9.4 mg/dL (ref 8.7–10.2)
Chloride: 103 mmol/L (ref 97–108)
GFR, EST AFRICAN AMERICAN: 95 mL/min/{1.73_m2} (ref >59)
GFR, EST NON AFRICAN AMERICAN: 82 mL/min/{1.73_m2} (ref >59)
GLUCOSE: 87 mg/dL (ref 65–99)
POTASSIUM: 4.4 mmol/L (ref 3.5–5.2)
Sodium: 143 mmol/L (ref 134–144)

## 2014-09-22 LAB — VITAMIN D 25 HYDROXY (VIT D DEFICIENCY, FRACTURES): VIT D 25 HYDROXY: 11.8 ng/mL — AB (ref 30.0–100.0)

## 2014-09-22 LAB — TSH: TSH: 1.3 u[IU]/mL (ref 0.450–4.500)

## 2014-09-23 ENCOUNTER — Other Ambulatory Visit: Payer: Self-pay | Admitting: Nurse Practitioner

## 2014-09-23 MED ORDER — VITAMIN D (ERGOCALCIFEROL) 1.25 MG (50000 UNIT) PO CAPS: 50000.0000 [IU] | ORAL_CAPSULE | ORAL | Status: DC

## 2014-09-30 DIAGNOSIS — R109 Unspecified abdominal pain: Secondary | ICD-10-CM | POA: Insufficient documentation

## 2014-09-30 NOTE — Assessment & Plan Note (Signed)
Patient with relatively new onset of bowel changes approximately 7-8 months ago. Admits abdominal pain, rectal pain, sensation of incomplete emptying, and toilet tissue hematochezia. Patient has never had a colonoscopy which given that she is African-American female is recommended to begin at age 49. She is currently age 49. Her father has had multiple benign colon polyps. Due to her presentation, family history of multiple polyps, and age given that she is generally due for initial screening colonoscopy we will received with a colonoscopy at this point to further evaluate her symptoms.  Proceed with TCS with Dr. Gala Romney in near future: the risks, benefits, and alternatives have been discussed with the patient in detail. The patient states understanding and desires to proceed.  The patient is not on any anticoagulants, diabetes medications, her chronic pain medications. Admits to social consumption of alcohol at a minimal level, approximately 4 less than 1 per week. Conscious sedation should be adequate in this situation.

## 2014-09-30 NOTE — Assessment & Plan Note (Signed)
Patient with abdominal pain associated with change in bowel habits, rectal bleeding, rectal pain, and sensation of incomplete emptying. Alternates between diarrhea and constipation. Today we'll check a CBC, CMP, and lipase to further evaluate her abdominal pain. We'll also arrange for colonoscopy due to her presentation to evaluate for more insidious etiology.

## 2014-09-30 NOTE — Assessment & Plan Note (Addendum)
Patient with a history of hemorrhoids and associated toilet tissue hematochezia as well as rectal pain irritation, and sensation of incomplete emptying. Given her presentation, need for initial screening colonoscopy anyways, we'll proceed with a colonoscopy at this point. This will also further evaluate her symptoms. Likely benign anorectal source, however cannot rule out more insidious process. We'll also check a CBC at this point.  Proceed with TCS with Dr. Gala Romney in near future: the risks, benefits, and alternatives have been discussed with the patient in detail. The patient states understanding and desires to proceed.  The patient is not on any anticoagulants, diabetes medications, her chronic pain medications. Admits to social consumption of alcohol at a minimal level, approximately 4 less than 1 per week. Conscious sedation should be adequate in this situation.

## 2014-09-30 NOTE — Assessment & Plan Note (Signed)
Patient with a history of hemorrhoids which seem to be aggravating at this point. Plans of rectal pain, rectal irritation, toilet tissue hematochezia. We are planning to proceed with an initial colonoscopy for diagnostic purposes given her presentation as noted in the other assessment and plan notes. However for symptomatic management of her hemorrhoids at this time we'll send in prescription for Anusol suppositories to use twice a day as needed for up to 7-10 days at a time.

## 2014-10-06 NOTE — Progress Notes (Signed)
cc'ed to pcp °

## 2014-10-12 LAB — COMPREHENSIVE METABOLIC PANEL
ALBUMIN: 4 g/dL (ref 3.5–5.2)
ALT: 14 U/L (ref 0–35)
AST: 12 U/L (ref 0–37)
Alkaline Phosphatase: 44 U/L (ref 39–117)
BUN: 8 mg/dL (ref 6–23)
CALCIUM: 8.9 mg/dL (ref 8.4–10.5)
CO2: 24 meq/L (ref 19–32)
Chloride: 104 mEq/L (ref 96–112)
Creat: 0.8 mg/dL (ref 0.50–1.10)
GLUCOSE: 79 mg/dL (ref 70–99)
POTASSIUM: 3.9 meq/L (ref 3.5–5.3)
Sodium: 137 mEq/L (ref 135–145)
Total Bilirubin: 0.5 mg/dL (ref 0.2–1.2)
Total Protein: 7.1 g/dL (ref 6.0–8.3)

## 2014-10-12 LAB — LIPASE: Lipase: 34 U/L (ref 0–75)

## 2014-10-13 ENCOUNTER — Ambulatory Visit (HOSPITAL_COMMUNITY)
Admission: RE | Admit: 2014-10-13 | Discharge: 2014-10-13 | Disposition: A | Discharge status: Home / Self Care | Payer: BLUE CROSS/BLUE SHIELD | Source: Ambulatory Visit | Attending: Internal Medicine | Admitting: Internal Medicine

## 2014-10-13 ENCOUNTER — Encounter (HOSPITAL_COMMUNITY)
Admission: RE | Disposition: A | Discharge status: Home / Self Care | Payer: Self-pay | Source: Ambulatory Visit | Attending: Internal Medicine

## 2014-10-13 ENCOUNTER — Encounter (HOSPITAL_COMMUNITY): Payer: Self-pay | Admitting: *Deleted

## 2014-10-13 DIAGNOSIS — Z8371 Family history of colonic polyps: Secondary | ICD-10-CM | POA: Insufficient documentation

## 2014-10-13 DIAGNOSIS — I10 Essential (primary) hypertension: Secondary | ICD-10-CM | POA: Diagnosis not present

## 2014-10-13 DIAGNOSIS — Z7982 Long term (current) use of aspirin: Secondary | ICD-10-CM | POA: Diagnosis not present

## 2014-10-13 DIAGNOSIS — E785 Hyperlipidemia, unspecified: Secondary | ICD-10-CM | POA: Insufficient documentation

## 2014-10-13 DIAGNOSIS — F419 Anxiety disorder, unspecified: Secondary | ICD-10-CM | POA: Diagnosis not present

## 2014-10-13 DIAGNOSIS — D127 Benign neoplasm of rectosigmoid junction: Secondary | ICD-10-CM | POA: Diagnosis not present

## 2014-10-13 DIAGNOSIS — D122 Benign neoplasm of ascending colon: Secondary | ICD-10-CM | POA: Diagnosis not present

## 2014-10-13 DIAGNOSIS — Z86010 Personal history of colonic polyps: Secondary | ICD-10-CM | POA: Diagnosis not present

## 2014-10-13 DIAGNOSIS — K5730 Diverticulosis of large intestine without perforation or abscess without bleeding: Secondary | ICD-10-CM | POA: Diagnosis not present

## 2014-10-13 DIAGNOSIS — K635 Polyp of colon: Secondary | ICD-10-CM

## 2014-10-13 DIAGNOSIS — K921 Melena: Secondary | ICD-10-CM

## 2014-10-13 DIAGNOSIS — D120 Benign neoplasm of cecum: Secondary | ICD-10-CM | POA: Insufficient documentation

## 2014-10-13 DIAGNOSIS — K625 Hemorrhage of anus and rectum: Secondary | ICD-10-CM

## 2014-10-13 DIAGNOSIS — K644 Residual hemorrhoidal skin tags: Secondary | ICD-10-CM | POA: Diagnosis not present

## 2014-10-13 DIAGNOSIS — R194 Change in bowel habit: Secondary | ICD-10-CM | POA: Diagnosis present

## 2014-10-13 DIAGNOSIS — Z79899 Other long term (current) drug therapy: Secondary | ICD-10-CM | POA: Insufficient documentation

## 2014-10-13 HISTORY — PX: COLONOSCOPY: SHX5424

## 2014-10-13 SURGERY — COLONOSCOPY
Anesthesia: Moderate Sedation

## 2014-10-13 MED ORDER — STERILE WATER FOR IRRIGATION IR SOLN
Status: DC | PRN
  Administered 2014-10-13: 09:00:00

## 2014-10-13 MED ORDER — MEPERIDINE HCL 100 MG/ML IJ SOLN
INTRAMUSCULAR | Status: AC
  Filled 2014-10-13: qty 2

## 2014-10-13 MED ORDER — ONDANSETRON HCL 4 MG/2ML IJ SOLN
INTRAMUSCULAR | Status: DC | PRN
  Administered 2014-10-13: 4 mg via INTRAVENOUS

## 2014-10-13 MED ORDER — SODIUM CHLORIDE 0.9 % IV SOLN
INTRAVENOUS | Status: DC
  Administered 2014-10-13: 08:00:00 via INTRAVENOUS

## 2014-10-13 MED ORDER — MIDAZOLAM HCL 5 MG/5ML IJ SOLN
INTRAMUSCULAR | Status: DC | PRN
  Administered 2014-10-13 (×2): 2 mg via INTRAVENOUS
  Administered 2014-10-13 (×2): 1 mg via INTRAVENOUS

## 2014-10-13 MED ORDER — MEPERIDINE HCL 100 MG/ML IJ SOLN
INTRAMUSCULAR | Status: DC | PRN
Start: 2014-10-13 — End: 2014-10-13
  Administered 2014-10-13 (×3): 25 mg via INTRAVENOUS
  Administered 2014-10-13: 50 mg via INTRAVENOUS

## 2014-10-13 MED ORDER — ONDANSETRON HCL 4 MG/2ML IJ SOLN
INTRAMUSCULAR | Status: AC
  Filled 2014-10-13: qty 2

## 2014-10-13 MED ORDER — MIDAZOLAM HCL 5 MG/5ML IJ SOLN
INTRAMUSCULAR | Status: AC
  Filled 2014-10-13: qty 10

## 2014-10-13 NOTE — Op Note (Signed)
[facility] [address] [place], [address]   COLONOSCOPY PROCEDURE REPORT  PATIENT: [name], [name]  MR#: [id number] BIRTHDATE: 03/09/1966 , 48  yrs. old GENDER: female ENDOSCOPIST: R.  Garfield Cornea, MD FACP [facility] REFERRED [id number] Wolfgang Phoenix, M.D. PROCEDURE DATE:  November 12, 2014 PROCEDURE:   Colonoscopy with snare polypectomy INDICATIONS:Change in bowel habits; hematochezia. MEDICATIONS: Versed 6 mg IV and Demerol 125 mg IV in divided doses. Zofran 4 mg IV. ASA CLASS:       Class II  CONSENT: The risks, benefits, alternatives and imponderables including but not limited to bleeding, perforation as well as the possibility of a missed lesion have been reviewed.  The potential for biopsy, lesion removal, etc. have also been discussed. Questions have been answered.  All parties agreeable.  Please see the history and physical in the medical record for more information.  DESCRIPTION OF PROCEDURE:   After the risks benefits and alternatives of the procedure were thoroughly explained, informed consent was obtained.  The digital rectal exam revealed no abnormalities of the rectum.   The EC-[id number] ([id number])  endoscope was introduced through the anus and advanced to the   . No adverse events experienced.   The quality of the prep was     The instrument was then slowly withdrawn as the colon was fully examined.      COLON FINDINGS: Internal hemorrhoids; otherwise, normal-appearing rectum.  Scattered pancolonic diverticula; (1) 1 cm pedunculated polyp opposite the ileocecal valve; (1) 5 mm polyp in the ascending segment and (1) 5 mm polyp at the rectosigmoid junction.  The remainder of the colonic mucosa appeared normal.  Multiple hot and cold snare polypectomies performed.  Retroflexion was performed. .  Withdrawal time=11 minutes 0 seconds.  The scope was withdrawn and the procedure completed. COMPLICATIONS: There were no immediate complications.  ENDOSCOPIC  IMPRESSION: Multiple colonic polyps?"removed as described above. Colonic diverticulosis. Internal hemorrhoids - likely source of hematochezia.  RECOMMENDATIONS: Continue Anusol suppositories. Add Benefiber 2 teaspoons twice daily. MiraLAX 17 g orally at bedtime as needed to facilitate bowel evacuation. Follow-up on pathology.  eSigned:  R. Garfield Cornea, MD Rosalita Chessman [facility] November 12, 2014 9:21 AM   cc:  CPT CODES: ICD CODES:  The ICD and CPT codes recommended by this software are interpretations from the data that the clinical staff has captured with the software.  The verification of the translation of this report to the ICD and CPT codes and modifiers is the sole responsibility of the health care institution and practicing physician where this report was generated.  [employer] [place]. will not be held responsible for the validity of the ICD and CPT codes included on this report.  AMA assumes no liability for data contained or not contained herein. CPT is a Designer, television/film set of the [employer].  PATIENT NAME:  Shannon, Ashley MR#: [id number]

## 2014-10-13 NOTE — Discharge Instructions (Signed)
Colonoscopy Discharge Instructions  Read the instructions outlined below and refer to this sheet in the next few weeks. These discharge instructions provide you with general information on caring for yourself after you leave the hospital. Your doctor may also give you specific instructions. While your treatment has been planned according to the most current medical practices available, unavoidable complications occasionally occur. If you have any problems or questions after discharge, call Dr. Gala Romney at [phone]. ACTIVITY  You may resume your regular activity, but move at a slower pace for the next 24 hours.   Take frequent rest periods for the next 24 hours.   Walking will help get rid of the air and reduce the bloated feeling in your belly (abdomen).   No driving for 24 hours (because of the medicine (anesthesia) used during the test).    Do not sign any important legal documents or operate any machinery for 24 hours (because of the anesthesia used during the test).  NUTRITION  Drink plenty of fluids.   You may resume your normal diet as instructed by your doctor.   Begin with a light meal and progress to your normal diet. Heavy or fried foods are harder to digest and may make you feel sick to your stomach (nauseated).   Avoid alcoholic beverages for 24 hours or as instructed.  MEDICATIONS  You may resume your normal medications unless your doctor tells you otherwise.  WHAT YOU CAN EXPECT TODAY  Some feelings of bloating in the abdomen.   Passage of more gas than usual.   Spotting of blood in your stool or on the toilet paper.  IF YOU HAD POLYPS REMOVED DURING THE COLONOSCOPY:  No aspirin products for 7 days or as instructed.   No alcohol for 7 days or as instructed.   Eat a soft diet for the next 24 hours.  FINDING OUT THE RESULTS OF YOUR TEST Not all test results are available during your visit. If your test results are not back during the visit, make an appointment  with your caregiver to find out the results. Do not assume everything is normal if you have not heard from your caregiver or the medical facility. It is important for you to follow up on all of your test results.  SEEK IMMEDIATE MEDICAL ATTENTION IF:  You have more than a spotting of blood in your stool.   Your belly is swollen (abdominal distention).   You are nauseated or vomiting.   You have a temperature over 101.     Diverticulosis, polyp and hemorrhoid information provided  Continue hydrocortisone suppositories  Begin Benefiber 2 teaspoons twice daily  Take MiraLAX 17 g orally bedtime as needed to facilitate bowel function  Further recommendations to follow pending review of pathology report    Diverticulosis Diverticulosis is the condition that develops when small pouches (diverticula) form in the wall of your colon. Your colon, or large intestine, is where water is absorbed and stool is formed. The pouches form when the inside layer of your colon pushes through weak spots in the outer layers of your colon. CAUSES  No one knows exactly what causes diverticulosis. RISK FACTORS  Being older than 8. Your risk for this condition increases with age. Diverticulosis is rare in people younger than 40 years. By age 39, almost everyone has it.  Eating a low-fiber diet.  Being frequently constipated.  Being overweight.  Not getting enough exercise.  Smoking.  Taking over-the-counter pain medicines, like aspirin and ibuprofen. SYMPTOMS  Most people with diverticulosis do not have symptoms. DIAGNOSIS  Because diverticulosis often has no symptoms, health care providers often discover the condition during an exam for other colon problems. In many cases, a health care provider will diagnose diverticulosis while using a flexible scope to examine the colon (colonoscopy). TREATMENT  If you have never developed an infection related to diverticulosis, you may not need treatment.  If you have had an infection before, treatment may include:  Eating more fruits, vegetables, and grains.  Taking a fiber supplement.  Taking a live bacteria supplement (probiotic).  Taking medicine to relax your colon. HOME CARE INSTRUCTIONS   Drink at least 6-8 glasses of water each day to prevent constipation.  Try not to strain when you have a bowel movement.  Keep all follow-up appointments. If you have had an infection before:  Increase the fiber in your diet as directed by your health care provider or dietitian.  Take a dietary fiber supplement if your health care provider approves.  Only take medicines as directed by your health care provider. SEEK MEDICAL CARE IF:   You have abdominal pain.  You have bloating.  You have cramps.  You have not gone to the bathroom in 3 days. SEEK IMMEDIATE MEDICAL CARE IF:   Your pain gets worse.  Yourbloating becomes very bad.  You have a fever or chills, and your symptoms suddenly get worse.  You begin vomiting.  You have bowel movements that are bloody or black. MAKE SURE YOU:  Understand these instructions.  Will watch your condition.  Will get help right away if you are not doing well or get worse.   Colon Polyps Polyps are lumps of extra tissue growing inside the body. Polyps can grow in the large intestine (colon). Most colon polyps are noncancerous (benign). However, some colon polyps can become cancerous over time. Polyps that are larger than a pea may be harmful. To be safe, caregivers remove and test all polyps. CAUSES  Polyps form when mutations in the genes cause your cells to grow and divide even though no more tissue is needed. RISK FACTORS There are a number of risk factors that can increase your chances of getting colon polyps. They include:  Being older than 50 years.  Family history of colon polyps or colon cancer.  Long-term colon diseases, such as colitis or Crohn disease.  Being  overweight.  Smoking.  Being inactive.  Drinking too much alcohol. SYMPTOMS  Most small polyps do not cause symptoms. If symptoms are present, they may include:  Blood in the stool. The stool may look dark red or black.  Constipation or diarrhea that lasts longer than 1 week. DIAGNOSIS People often do not know they have polyps until their caregiver finds them during a regular checkup. Your caregiver can use 4 tests to check for polyps:  Digital rectal exam. The caregiver wears gloves and feels inside the rectum. This test would find polyps only in the rectum.  Barium enema. The caregiver puts a liquid called barium into your rectum before taking X-rays of your colon. Barium makes your colon look white. Polyps are dark, so they are easy to see in the X-ray pictures.  Sigmoidoscopy. A thin, flexible tube (sigmoidoscope) is placed into your rectum. The sigmoidoscope has a light and tiny camera in it. The caregiver uses the sigmoidoscope to look at the last third of your colon.  Colonoscopy. This test is like sigmoidoscopy, but the caregiver looks at the entire colon. This is  the most common method for finding and removing polyps. TREATMENT  Any polyps will be removed during a sigmoidoscopy or colonoscopy. The polyps are then tested for cancer. PREVENTION  To help lower your risk of getting more colon polyps:  Eat plenty of fruits and vegetables. Avoid eating fatty foods.  Do not smoke.  Avoid drinking alcohol.  Exercise every day.  Lose weight if recommended by your caregiver.  Eat plenty of calcium and folate. Foods that are rich in calcium include milk, cheese, and broccoli. Foods that are rich in folate include chickpeas, kidney beans, and spinach. HOME CARE INSTRUCTIONS Keep all follow-up appointments as directed by your caregiver. You may need periodic exams to check for polyps. SEEK MEDICAL CARE IF: You notice bleeding during a bowel  movement.   Hemorrhoids Hemorrhoids are swollen veins around the rectum or anus. There are two types of hemorrhoids:   Internal hemorrhoids. These occur in the veins just inside the rectum. They may poke through to the outside and become irritated and painful.  External hemorrhoids. These occur in the veins outside the anus and can be felt as a painful swelling or hard lump near the anus. CAUSES  Pregnancy.   Obesity.   Constipation or diarrhea.   Straining to have a bowel movement.   Sitting for long periods on the toilet.  Heavy lifting or other activity that caused you to strain.  Anal intercourse. SYMPTOMS   Pain.   Anal itching or irritation.   Rectal bleeding.   Fecal leakage.   Anal swelling.   One or more lumps around the anus.  DIAGNOSIS  Your caregiver may be able to diagnose hemorrhoids by visual examination. Other examinations or tests that may be performed include:   Examination of the rectal area with a gloved hand (digital rectal exam).   Examination of anal canal using a small tube (scope).   A blood test if you have lost a significant amount of blood.  A test to look inside the colon (sigmoidoscopy or colonoscopy). TREATMENT Most hemorrhoids can be treated at home. However, if symptoms do not seem to be getting better or if you have a lot of rectal bleeding, your caregiver may perform a procedure to help make the hemorrhoids get smaller or remove them completely. Possible treatments include:   Placing a rubber band at the base of the hemorrhoid to cut off the circulation (rubber band ligation).   Injecting a chemical to shrink the hemorrhoid (sclerotherapy).   Using a tool to burn the hemorrhoid (infrared light therapy).   Surgically removing the hemorrhoid (hemorrhoidectomy).   Stapling the hemorrhoid to block blood flow to the tissue (hemorrhoid stapling).  HOME CARE INSTRUCTIONS   Eat foods with fiber, such as whole  grains, beans, nuts, fruits, and vegetables. Ask your doctor about taking products with added fiber in them (fibersupplements).  Increase fluid intake. Drink enough water and fluids to keep your urine clear or pale yellow.   Exercise regularly.   Go to the bathroom when you have the urge to have a bowel movement. Do not wait.   Avoid straining to have bowel movements.   Keep the anal area dry and clean. Use wet toilet paper or moist towelettes after a bowel movement.   Medicated creams and suppositories may be used or applied as directed.   Only take over-the-counter or prescription medicines as directed by your caregiver.   Take warm sitz baths for 15-20 minutes, 3-4 times a day to ease pain  and discomfort.   Place ice packs on the hemorrhoids if they are tender and swollen. Using ice packs between sitz baths may be helpful.   Put ice in a plastic bag.   Place a towel between your skin and the bag.   Leave the ice on for 15-20 minutes, 3-4 times a day.   Do not use a donut-shaped pillow or sit on the toilet for long periods. This increases blood pooling and pain.  SEEK MEDICAL CARE IF:  You have increasing pain and swelling that is not controlled by treatment or medicine.  You have uncontrolled bleeding.  You have difficulty or you are unable to have a bowel movement.  You have pain or inflammation outside the area of the hemorrhoids. MAKE SURE YOU:  Understand these instructions.  Will watch your condition.  Will get help right away if you are not doing well or get worse. Document Released: 05/11/2000 Document Revised: 04/30/2012 Document Reviewed: 03/18/2012 [facility] Patient Information 2015 [place], [place]. This information is not intended to replace advice given to you by your health care provider. Make sure you discuss any questions you have with your health care provider.

## 2014-10-13 NOTE — Interval H&P Note (Signed)
History and Physical Interval Note:  10/13/2014 8:36 AM  Shannon Ashley  has presented today for surgery, with the diagnosis of RECTAL BLEEDING  The various methods of treatment have been discussed with the patient and family. After consideration of risks, benefits and other options for treatment, the patient has consented to  Procedure(s) with comments: COLONOSCOPY (N/A) - 830  as a surgical intervention .  The patient's history has been reviewed, patient examined, no change in status, stable for surgery.  I have reviewed the patient's chart and labs.  Questions were answered to the patient's satisfaction.     Shannon Ashley  Hydrocortisone topically has helped with some of the discomfort in bleeding. Still has some difficulty with evacuating. Diagnostic colonoscopy per plan.The risks, benefits, limitations, alternatives and imponderables have been reviewed with the patient. Questions have been answered. All parties are agreeable.

## 2014-10-13 NOTE — H&P (View-Only) (Signed)
Primary Care Physician:  Mickie Hillier, MD Primary Gastroenterologist:  Dr. Gala Romney  Chief Complaint  Patient presents with  . Abdominal Pain  . Constipation    HPI:   49 year old female presents on referral from PCP for hematochezia and bowel changes. PCP notes reviewed and indicates patient began having abdominal pain, rectal pain, sensation of incomplete emptying, and toilet tissue hematochezia which started about 7-8 months ago. Pertinent family history includes father with benign colonic polyps. Patient is African-American, has never had a colonoscopy, and current screening guidelines indicate initial colonoscopy for African-Americans a 47 years old.  Today she states she did begin having issues 7-8 months ago. Confirms the above including incomplete emptying, sometimes diarrhea and sometimes constipation. Probiotic yogurt has helped. Occasionally has fecal urgency with the diarrhea. Is also having occasional toilet tissue hematochezia. Hematochezia noted both with constipation and with diarrhea. Has a bowel movement usually every day. Denies melena. Also having occasional/rate dyspepsia, typically has an episode once a week. Symptoms are relieved by TUMS and she states she's ok with this at this time. Abdominal pain located in the epigastric and lower abdomen, occurs 2-3 times a week, is sometimes relieved by a bowel movement. Denies fever, chills, unintentional weight loss, vomiting. Does have occasional nausea. Denies chest pain, dyspnea, lightheadedness, dizziness, syncope, and near syncope. Denies additional upper or lower GI symptoms.  Past Medical History  Diagnosis Date  . Hypertension   . Hyperlipidemia   . Anxiety     Past Surgical History  Procedure Laterality Date  . Tubal ligation    . Endometrial ablation    . Ectopic pregnancy surgery      Current Outpatient Prescriptions  Medication Sig Dispense Refill  . ALPRAZolam (XANAX) 0.5 MG tablet TAKE ONE TABLET BY MOUTH  AT BEDTIME AS NEEDED FOR ANXIETY 30 tablet 0  . cetirizine (ZYRTEC) 10 MG tablet Take 10 mg by mouth as needed for allergies.    Marland Kitchen escitalopram (LEXAPRO) 20 MG tablet Take 1 tablet (20 mg total) by mouth daily. 30 tablet 2  . hydrochlorothiazide (MICROZIDE) 12.5 MG capsule TAKE ONE CAPSULE BY MOUTH DAILY 30 capsule 11  . simvastatin (ZOCOR) 20 MG tablet Take 1 tablet (20 mg total) by mouth daily. 30 tablet 11   No current facility-administered medications for this visit.    Allergies as of 09/21/2014  . (No Known Allergies)    Family History  Problem Relation Age of Onset  . Cancer Mother     lung  . Hypertension Mother   . Stroke Father   . Heart attack Father   . Congestive Heart Failure Father   . Kidney failure Father   . Colon polyps Father   . Hypertension Sister   . Drug abuse Sister   . Diabetes Maternal Uncle   . Early death Paternal 41   . Asthma Paternal Aunt   . Cancer Maternal Grandmother     lung  . Congestive Heart Failure Paternal Grandmother   . Cancer Paternal Grandmother     pancreatic    History   Social History  . Marital Status: Legally Separated    Spouse Name: N/A  . Number of Children: N/A  . Years of Education: N/A   Occupational History  . Not on file.   Social History Main Topics  . Smoking status: Never Smoker   . Smokeless tobacco: Never Used  . Alcohol Use: 2.4 oz/week    4 Glasses of wine per week  . Drug  Use: No  . Sexual Activity: No   Other Topics Concern  . Not on file   Social History Narrative    Review of Systems: General: Negative for anorexia, weight loss, fever, chills, fatigue, weakness. Eyes: Negative for vision changes.  ENT: Negative for hoarseness, difficulty swallowing. CV: Negative for chest pain, angina, palpitations, peripheral edema.  Respiratory: Negative for dyspnea at rest, cough, wheezing.  GI: See history of present illness. MS: Negative for joint pain, low back pain.  Derm: Negative for  rash or itching.  Neuro: Negative for weakness, memory loss, confusion.  Psych: Negative for anxiety, depression.  Endo: Negative for unusual weight change.  Heme: Negative for bruising or bleeding. Allergy: Negative for rash or hives.    Physical Exam: BP 145/95 mmHg  Pulse 69  Temp(Src) 97.4 F (36.3 C)  Ht 5\' 6"  (1.676 m)  Wt 198 lb (89.812 kg)  BMI 31.97 kg/m2  LMP 09/20/2014 General:   Alert and oriented. Pleasant and cooperative. Well-nourished and well-developed.  Head:  Normocephalic and atraumatic. Eyes:  Without icterus, sclera clear and conjunctiva pink.  Ears:  Normal auditory acuity. Mouth:  No deformity or lesions, oral mucosa pink. No OP edema. Neck:  Supple, without mass or thyromegaly. Lungs:  Clear to auscultation bilaterally. No wheezes, rales, or rhonchi. No distress.  Heart:  S1, S2 present without murmurs appreciated.  Abdomen:  +BS, soft, and non-distended. Mild to moderate TTP lower abdomen. No HSM noted. No guarding or rebound. No masses appreciated.  Rectal:  Deferred  Msk:  Symmetrical without gross deformities. Normal posture. Pulses:  Normal DP pulses noted. Extremities:  Without clubbing or edema. Neurologic:  Alert and  oriented x4;  grossly normal neurologically. Skin:  Intact without significant lesions or rashes. Psych:  Alert and cooperative. Normal mood and affect.     09/21/2014 8:30 AM

## 2014-10-14 ENCOUNTER — Encounter (HOSPITAL_COMMUNITY): Payer: Self-pay | Admitting: Internal Medicine

## 2014-10-18 ENCOUNTER — Encounter: Payer: Self-pay | Admitting: Internal Medicine

## 2014-10-27 ENCOUNTER — Other Ambulatory Visit: Payer: Self-pay | Admitting: Nurse Practitioner

## 2014-11-01 ENCOUNTER — Telehealth: Payer: Self-pay | Admitting: *Deleted

## 2014-11-01 NOTE — Telephone Encounter (Signed)
Fax request from [facility] for HCTZ 12.5 #30 one qd. Originally prescribed by Derrek Monaco. Last seen 08/27/14.

## 2014-11-02 ENCOUNTER — Other Ambulatory Visit: Payer: Self-pay | Admitting: Nurse Practitioner

## 2014-11-02 MED ORDER — HYDROCHLOROTHIAZIDE 12.5 MG PO CAPS: 12.5000 mg | ORAL_CAPSULE | Freq: Every day | ORAL | Status: DC

## 2014-11-24 ENCOUNTER — Ambulatory Visit: Payer: BLUE CROSS/BLUE SHIELD | Admitting: Nurse Practitioner

## 2014-12-15 ENCOUNTER — Encounter: Payer: Self-pay | Admitting: Nurse Practitioner

## 2014-12-15 ENCOUNTER — Ambulatory Visit (INDEPENDENT_AMBULATORY_CARE_PROVIDER_SITE_OTHER): Payer: BLUE CROSS/BLUE SHIELD | Admitting: Nurse Practitioner

## 2014-12-15 VITALS — BP 138/88 | Wt 197.0 lb

## 2014-12-15 DIAGNOSIS — F419 Anxiety disorder, unspecified: Secondary | ICD-10-CM

## 2014-12-15 DIAGNOSIS — B9689 Other specified bacterial agents as the cause of diseases classified elsewhere: Secondary | ICD-10-CM

## 2014-12-15 DIAGNOSIS — J069 Acute upper respiratory infection, unspecified: Secondary | ICD-10-CM | POA: Diagnosis not present

## 2014-12-15 MED ORDER — AZITHROMYCIN 250 MG PO TABS: ORAL_TABLET | ORAL | Status: DC

## 2014-12-15 MED ORDER — FLUTICASONE PROPIONATE 50 MCG/ACT NA SUSP: 2.0000 | Freq: Every day | NASAL | Status: DC

## 2014-12-15 MED ORDER — FLUCONAZOLE 150 MG PO TABS: ORAL_TABLET | ORAL | Status: DC

## 2014-12-15 NOTE — Patient Instructions (Signed)
Vitamin D 1000 units per day

## 2014-12-17 ENCOUNTER — Encounter: Payer: Self-pay | Admitting: Nurse Practitioner

## 2014-12-17 NOTE — Progress Notes (Signed)
Subjective:  Presents for routine follow-up on her anxiety. Currently on Lexapro 20 mg daily which is working well. Takes Xanax only on a when necessary basis, not daily. Had a recent colonoscopy which did have some precancerous polyps, to repeat in 3-5 years. Also complaints of head congestion and cough for the past 4 days. Cough has improved. Postnasal drainage. No headache sore throat or ear pain. No wheezing. Slight yellow mucus. Currently on Flonase and Robitussin-DM.   Objective:   BP 138/88 mmHg  Wt 197 lb (89.359 kg) NAD. Alert, oriented. TMs retracted, no erythema. Pharynx injected with PND noted. Neck supple with mild soft anterior adenopathy. Lungs clear. Heart regular rate rhythm.  Assessment:  Problem List Items Addressed This Visit      Other   Anxiety - Primary    Other Visit Diagnoses    Bacterial upper respiratory infection        Relevant Medications    azithromycin (ZITHROMAX Z-PAK) 250 MG tablet    fluconazole (DIFLUCAN) 150 MG tablet      Plan:  Meds ordered this encounter  Medications  . azithromycin (ZITHROMAX Z-PAK) 250 MG tablet    Sig: Take 2 tablets (500 mg) on  Day 1,  followed by 1 tablet (250 mg) once daily on Days 2 through 5.    Dispense:  6 each    Refill:  0    Order Specific Question:  Supervising Provider    Answer:  Mikey Kirschner [[id number]]  . fluconazole (DIFLUCAN) 150 MG tablet    Sig: One po qd prn yeast infection; may repeat in 3-4 days if needed    Dispense:  2 tablet    Refill:  0    Order Specific Question:  Supervising Provider    Answer:  Mikey Kirschner [[id number]]  . fluticasone (FLONASE) 50 MCG/ACT nasal spray    Sig: Place 2 sprays into both nostrils daily.    Dispense:  16 g    Refill:  5    Order Specific Question:  Supervising Provider    Answer:  Mikey Kirschner [[id number]]   Continue OTC meds as directed for congestion. Continue Lexapro and Xanax as directed. Discussed importance of regular activity and stress reduction. Call  back if sinus symptoms worsen or persist. Return in about 6 months (around 06/17/2015) for recheck.

## 2015-02-02 ENCOUNTER — Other Ambulatory Visit: Payer: Self-pay | Admitting: Nurse Practitioner

## 2015-04-26 ENCOUNTER — Encounter: Payer: Self-pay | Admitting: Family Medicine

## 2015-04-26 ENCOUNTER — Ambulatory Visit (INDEPENDENT_AMBULATORY_CARE_PROVIDER_SITE_OTHER): Payer: BLUE CROSS/BLUE SHIELD | Admitting: Family Medicine

## 2015-04-26 VITALS — Temp 98.6°F | Ht 66.75 in | Wt 203.6 lb

## 2015-04-26 DIAGNOSIS — L259 Unspecified contact dermatitis, unspecified cause: Secondary | ICD-10-CM

## 2015-04-26 MED ORDER — TRIAMCINOLONE ACETONIDE 0.1 % EX CREA: 1.0000 "application " | TOPICAL_CREAM | Freq: Two times a day (BID) | CUTANEOUS | Status: DC

## 2015-04-26 MED ORDER — PREDNISONE 20 MG PO TABS: ORAL_TABLET | ORAL | Status: DC

## 2015-04-26 NOTE — Progress Notes (Signed)
   Subjective:    Patient ID: Shannon Ashley, female    DOB: [birth year], 49 y.o.   MRN: [id number]  HPI Patient arrives with c/o rash on arms, legs and chest off and on for 3- months. Patient has tried benadryl and hydrocortisone cream.  patient relates it itches she wonders if she may be getting exposed to things at work but she isn't really syrup normal neck. She denies any new soaps detergents or creams. Review of Systems     Objective:   Physical Exam   patient with a erythematous rash noted on the left arm in the chest papular in nature multiple clusters does not appear to be weeping no sign of any type of valve viral illness. Does not have the scattered appearance that scabies does. More than likely contact dermatitis      Assessment & Plan:   contact dermatitis patient requests not to use prednisone at this point prescription was printed given can use if symptoms otherwise I would recommend topical steroid creams and Benadryl caution drowsiness follow-up if progressive troubles if repetitive problems with this referral for dermatology

## 2015-06-07 ENCOUNTER — Other Ambulatory Visit: Payer: Self-pay | Admitting: Family Medicine

## 2015-06-17 ENCOUNTER — Encounter: Payer: Self-pay | Admitting: Nurse Practitioner

## 2015-06-17 ENCOUNTER — Ambulatory Visit (INDEPENDENT_AMBULATORY_CARE_PROVIDER_SITE_OTHER): Payer: BLUE CROSS/BLUE SHIELD | Admitting: Nurse Practitioner

## 2015-06-17 VITALS — BP 158/108 | Ht 66.0 in | Wt 201.0 lb

## 2015-06-17 DIAGNOSIS — I10 Essential (primary) hypertension: Secondary | ICD-10-CM

## 2015-06-17 DIAGNOSIS — F419 Anxiety disorder, unspecified: Secondary | ICD-10-CM | POA: Diagnosis not present

## 2015-06-17 DIAGNOSIS — G4700 Insomnia, unspecified: Secondary | ICD-10-CM

## 2015-06-17 MED ORDER — ALPRAZOLAM 0.5 MG PO TABS: 0.5000 mg | ORAL_TABLET | Freq: Two times a day (BID) | ORAL | Status: DC | PRN

## 2015-06-17 MED ORDER — LISINOPRIL 10 MG PO TABS: 10.0000 mg | ORAL_TABLET | Freq: Every day | ORAL | Status: DC

## 2015-06-17 MED ORDER — TRAZODONE HCL 50 MG PO TABS: ORAL_TABLET | ORAL | Status: DC

## 2015-06-17 NOTE — Progress Notes (Signed)
Subjective:  Presents for routine follow-up. Blood pressure has been running high outside of the office. Has been under tremendous stress. She and her husband are undergoing a divorce. Having major financial issues. Currently taking Xanax at bedtime but would like a dose to take during the day if needed. Having significant insomnia at nighttime. No chest pain shortness of breath or edema.  Objective:   BP 158/108 mmHg  Ht 5\' 6"  (1.676 m)  Wt 201 lb (91.173 kg)  BMI 32.46 kg/m2 NAD. Alert, oriented. Thoughts logical coherent and relevant. Mildly fatigued in appearance. Mildly anxious affect. Lungs clear. Heart regular rate rhythm. Carotids no bruits or thrills. Lower extremities no edema, exam limited. BP on recheck right arm sitting 160/100.  Assessment:  Problem List Items Addressed This Visit      Cardiovascular and Mediastinum   Hypertension - Primary   Relevant Medications   lisinopril (PRINIVIL,ZESTRIL) 10 MG tablet     Other   Anxiety   Relevant Medications   ALPRAZolam (XANAX) 0.5 MG tablet   traZODone (DESYREL) 50 MG tablet    Other Visit Diagnoses    Insomnia          Plan:  Meds ordered this encounter  Medications  . ALPRAZolam (XANAX) 0.5 MG tablet    Sig: Take 1 tablet (0.5 mg total) by mouth 2 (two) times daily as needed for anxiety.    Dispense:  60 tablet    Refill:  2    [facility]    Order Specific Question:  Supervising Provider    Answer:  Mikey Kirschner [[id number]]  . traZODone (DESYREL) 50 MG tablet    Sig: One po qhs for sleep    Dispense:  30 tablet    Refill:  2    Order Specific Question:  Supervising Provider    Answer:  Mikey Kirschner [[id number]]  . lisinopril (PRINIVIL,ZESTRIL) 10 MG tablet    Sig: Take 1 tablet (10 mg total) by mouth daily.    Dispense:  90 tablet    Refill:  0    Order Specific Question:  Supervising Provider    Answer:  Mikey Kirschner [[id number]]   Increase Xanax to twice a day dosing. Add trazodone 50 mg half tab by  mouth daily at bedtime for sleep and if needed increase to one by mouth daily at bedtime. Add lisinopril to regimen for blood pressure. Patient to check BP outside the office and sent results in 2-3 weeks. Discussed importance of stress reduction including healthy diet and regular exercise. Patient does not have the time or the money for mental health counseling at this point. To seek help immediately if symptoms worsen. Return in about 3 months (around 09/15/2015) for recheck.

## 2015-09-15 ENCOUNTER — Ambulatory Visit: Payer: BLUE CROSS/BLUE SHIELD | Admitting: Nurse Practitioner

## 2015-10-06 ENCOUNTER — Other Ambulatory Visit: Payer: Self-pay | Admitting: Nurse Practitioner

## 2015-10-28 ENCOUNTER — Other Ambulatory Visit: Payer: Self-pay | Admitting: Nurse Practitioner

## 2016-01-26 ENCOUNTER — Other Ambulatory Visit: Payer: Self-pay | Admitting: Nurse Practitioner

## 2016-02-03 ENCOUNTER — Encounter: Payer: Self-pay | Admitting: Nurse Practitioner

## 2016-02-03 ENCOUNTER — Other Ambulatory Visit: Payer: Self-pay | Admitting: Nurse Practitioner

## 2016-02-03 ENCOUNTER — Ambulatory Visit (INDEPENDENT_AMBULATORY_CARE_PROVIDER_SITE_OTHER): Payer: BLUE CROSS/BLUE SHIELD | Admitting: Nurse Practitioner

## 2016-02-03 VITALS — BP 152/96 | Ht 66.0 in | Wt 207.0 lb

## 2016-02-03 DIAGNOSIS — J300 Vasomotor rhinitis: Secondary | ICD-10-CM | POA: Diagnosis not present

## 2016-02-03 DIAGNOSIS — F419 Anxiety disorder, unspecified: Secondary | ICD-10-CM

## 2016-02-03 DIAGNOSIS — E785 Hyperlipidemia, unspecified: Secondary | ICD-10-CM

## 2016-02-03 DIAGNOSIS — I10 Essential (primary) hypertension: Secondary | ICD-10-CM | POA: Diagnosis not present

## 2016-02-03 DIAGNOSIS — E559 Vitamin D deficiency, unspecified: Secondary | ICD-10-CM

## 2016-02-03 MED ORDER — SIMVASTATIN 20 MG PO TABS: 20.0000 mg | ORAL_TABLET | Freq: Every day | ORAL | 5 refills | Status: DC

## 2016-02-03 MED ORDER — VALSARTAN 160 MG PO TABS: 160.0000 mg | ORAL_TABLET | Freq: Every day | ORAL | 2 refills | Status: DC

## 2016-02-03 MED ORDER — TRAZODONE HCL 50 MG PO TABS: ORAL_TABLET | ORAL | 2 refills | Status: DC

## 2016-02-03 MED ORDER — HYDROCHLOROTHIAZIDE 12.5 MG PO CAPS: ORAL_CAPSULE | ORAL | 2 refills | Status: DC

## 2016-02-03 MED ORDER — ESCITALOPRAM OXALATE 20 MG PO TABS: ORAL_TABLET | ORAL | 5 refills | Status: DC

## 2016-02-03 MED ORDER — ALPRAZOLAM 0.5 MG PO TABS: 0.5000 mg | ORAL_TABLET | Freq: Two times a day (BID) | ORAL | 2 refills | Status: DC | PRN

## 2016-02-04 ENCOUNTER — Encounter: Payer: Self-pay | Admitting: Nurse Practitioner

## 2016-02-04 NOTE — Progress Notes (Signed)
Subjective:  Presents for routine follow up. Was out of HCTZ for about a week but has restarted. Has taken med today. Has been under tremendous stress this summer due to family issues. BP has been running extremely high at times. Has developed a persistent hacky dry cough. No specific triggers or patterns. Mild head congestion. No fever, sore throat or ear pain. No CP/ischemic type pain or SOB. No N/V. No overt reflux symptoms but does feel like something is "getting stuck at times" near the lower sternum upper epigastric area at times about 1-2 days of the week. Drinks a large amount of caffeine.   Objective:   BP (!) 152/96   Ht 5\' 6"  (1.676 m)   Wt 207 lb (93.9 kg)   BMI 33.41 kg/m  NAD. Alert, oriented. TMs clear effusion. Pharynx mildly injected with clear PND noted. Neck supple with mild anterior adenopathy. Lungs clear. Heart RRR. Abdomen soft, non tender. Lower extremities mild edema right ankle.   Assessment:  Problem List Items Addressed This Visit      Cardiovascular and Mediastinum   Hypertension - Primary   Relevant Medications   simvastatin (ZOCOR) 20 MG tablet   hydrochlorothiazide (MICROZIDE) 12.5 MG capsule   valsartan (DIOVAN) 160 MG tablet   Other Relevant Orders   Basic metabolic panel     Other   Anxiety   Relevant Medications   traZODone (DESYREL) 50 MG tablet   ALPRAZolam (XANAX) 0.5 MG tablet   escitalopram (LEXAPRO) 20 MG tablet   Hyperlipidemia   Relevant Medications   simvastatin (ZOCOR) 20 MG tablet   hydrochlorothiazide (MICROZIDE) 12.5 MG capsule   valsartan (DIOVAN) 160 MG tablet   Other Relevant Orders   Lipid panel   Hepatic function panel   Vitamin D deficiency   Relevant Orders   VITAMIN D 25 Hydroxy (Vit-D Deficiency, Fractures)    Other Visit Diagnoses    Vasomotor rhinitis         Plan: Meds ordered this encounter  Medications  . traZODone (DESYREL) 50 MG tablet    Sig: One po qhs for sleep    Dispense:  30 tablet    Refill:  2     Order Specific Question:   Supervising Provider    Answer:   Mikey Kirschner [[id number]]  . ALPRAZolam (XANAX) 0.5 MG tablet    Sig: Take 1 tablet (0.5 mg total) by mouth 2 (two) times daily as needed for anxiety.    Dispense:  60 tablet    Refill:  2     PHARMACY    Order Specific Question:   Supervising Provider    Answer:   Mikey Kirschner [[id number]]  . escitalopram (LEXAPRO) 20 MG tablet    Sig: TAKE ONE (1) TABLET EACH DAY    Dispense:  30 tablet    Refill:  5    Order Specific Question:   Supervising Provider    Answer:   Mikey Kirschner [[id number]]  . simvastatin (ZOCOR) 20 MG tablet    Sig: Take 1 tablet (20 mg total) by mouth daily.    Dispense:  30 tablet    Refill:  5    Order Specific Question:   Supervising Provider    Answer:   Mikey Kirschner [[id number]]  . hydrochlorothiazide (MICROZIDE) 12.5 MG capsule    Sig: TAKE ONE (1) CAPSULE EACH DAY    Dispense:  30 capsule    Refill:  2    Order Specific Question:  Supervising Provider    Answer:   Mikey Kirschner [[id number]]  . valsartan (DIOVAN) 160 MG tablet    Sig: Take 1 tablet (160 mg total) by mouth daily. For BP    Dispense:  30 tablet    Refill:  2    Order Specific Question:   Supervising Provider    Answer:   Mikey Kirschner [[id number]]   OTC meds as directed for head congestion. Will switch to Diovan due to chronic cough. Also recommend OTC Zantac to cover for possible reflux. Encouraged regular activity and weight loss. Discussed stress reduction. Slowly reduce caffeine intake. Check BP outside office and call back if it remains over 140/90. Return in about 3 months (around 05/04/2016) for recheck.

## 2016-04-22 DIAGNOSIS — M76821 Posterior tibial tendinitis, right leg: Secondary | ICD-10-CM | POA: Diagnosis not present

## 2016-05-07 ENCOUNTER — Ambulatory Visit: Payer: BLUE CROSS/BLUE SHIELD | Admitting: Nurse Practitioner

## 2016-05-26 ENCOUNTER — Other Ambulatory Visit: Payer: Self-pay | Admitting: Nurse Practitioner

## 2016-05-29 ENCOUNTER — Other Ambulatory Visit: Payer: Self-pay | Admitting: Adult Health

## 2016-05-29 DIAGNOSIS — Z1231 Encounter for screening mammogram for malignant neoplasm of breast: Secondary | ICD-10-CM

## 2016-06-01 ENCOUNTER — Ambulatory Visit (HOSPITAL_COMMUNITY): Payer: BLUE CROSS/BLUE SHIELD

## 2016-06-01 ENCOUNTER — Ambulatory Visit: Payer: BLUE CROSS/BLUE SHIELD | Admitting: Nurse Practitioner

## 2016-06-04 ENCOUNTER — Ambulatory Visit (HOSPITAL_COMMUNITY)
Admission: RE | Admit: 2016-06-04 | Discharge: 2016-06-04 | Disposition: A | Discharge status: Home / Self Care | Payer: BLUE CROSS/BLUE SHIELD | Source: Ambulatory Visit | Attending: Adult Health | Admitting: Adult Health

## 2016-06-04 ENCOUNTER — Encounter (HOSPITAL_COMMUNITY): Payer: Self-pay | Admitting: Radiology

## 2016-06-04 DIAGNOSIS — Z1231 Encounter for screening mammogram for malignant neoplasm of breast: Secondary | ICD-10-CM

## 2016-06-04 DIAGNOSIS — E785 Hyperlipidemia, unspecified: Secondary | ICD-10-CM | POA: Diagnosis not present

## 2016-06-04 DIAGNOSIS — I10 Essential (primary) hypertension: Secondary | ICD-10-CM | POA: Diagnosis not present

## 2016-06-04 DIAGNOSIS — E559 Vitamin D deficiency, unspecified: Secondary | ICD-10-CM | POA: Diagnosis not present

## 2016-06-05 LAB — BASIC METABOLIC PANEL
BUN/Creatinine Ratio: 15 (ref 9–23)
BUN: 13 mg/dL (ref 6–24)
CALCIUM: 9.6 mg/dL (ref 8.7–10.2)
CHLORIDE: 98 mmol/L (ref 96–106)
CO2: 26 mmol/L (ref 18–29)
Creatinine, Ser: 0.87 mg/dL (ref 0.57–1.00)
GFR calc Af Amer: 91 mL/min/{1.73_m2} (ref >59)
GFR calc non Af Amer: 79 mL/min/{1.73_m2} (ref >59)
Glucose: 86 mg/dL (ref 65–99)
POTASSIUM: 4.4 mmol/L (ref 3.5–5.2)
SODIUM: 138 mmol/L (ref 134–144)

## 2016-06-05 LAB — LIPID PANEL
CHOL/HDL RATIO: 3.8 ratio (ref 0.0–4.4)
Cholesterol, Total: 223 mg/dL — ABNORMAL HIGH (ref 100–199)
HDL: 59 mg/dL (ref >39)
LDL Calculated: 148 mg/dL — ABNORMAL HIGH (ref 0–99)
Triglycerides: 80 mg/dL (ref 0–149)
VLDL Cholesterol Cal: 16 mg/dL (ref 5–40)

## 2016-06-05 LAB — HEPATIC FUNCTION PANEL
ALT: 24 IU/L (ref 0–32)
AST: 16 IU/L (ref 0–40)
Albumin: 4.5 g/dL (ref 3.5–5.5)
Alkaline Phosphatase: 65 IU/L (ref 39–117)
Bilirubin Total: 0.3 mg/dL (ref 0.0–1.2)
Bilirubin, Direct: 0.08 mg/dL (ref 0.00–0.40)
TOTAL PROTEIN: 7.7 g/dL (ref 6.0–8.5)

## 2016-06-05 LAB — VITAMIN D 25 HYDROXY (VIT D DEFICIENCY, FRACTURES): Vit D, 25-Hydroxy: 23.2 ng/mL — ABNORMAL LOW (ref 30.0–100.0)

## 2016-06-08 ENCOUNTER — Encounter: Payer: Self-pay | Admitting: Nurse Practitioner

## 2016-06-08 ENCOUNTER — Ambulatory Visit (INDEPENDENT_AMBULATORY_CARE_PROVIDER_SITE_OTHER): Payer: BLUE CROSS/BLUE SHIELD | Admitting: Nurse Practitioner

## 2016-06-08 VITALS — BP 128/82 | Ht 66.0 in | Wt 206.6 lb

## 2016-06-08 DIAGNOSIS — E785 Hyperlipidemia, unspecified: Secondary | ICD-10-CM | POA: Diagnosis not present

## 2016-06-08 DIAGNOSIS — E559 Vitamin D deficiency, unspecified: Secondary | ICD-10-CM | POA: Diagnosis not present

## 2016-06-08 DIAGNOSIS — I10 Essential (primary) hypertension: Secondary | ICD-10-CM | POA: Diagnosis not present

## 2016-06-08 DIAGNOSIS — F419 Anxiety disorder, unspecified: Secondary | ICD-10-CM

## 2016-06-08 MED ORDER — HYDROCHLOROTHIAZIDE 12.5 MG PO CAPS: ORAL_CAPSULE | ORAL | 5 refills | Status: DC

## 2016-06-08 MED ORDER — VALSARTAN 160 MG PO TABS: ORAL_TABLET | ORAL | 5 refills | Status: DC

## 2016-06-08 MED ORDER — TRAZODONE HCL 50 MG PO TABS: ORAL_TABLET | ORAL | 5 refills | Status: DC

## 2016-06-08 MED ORDER — VITAMIN D (ERGOCALCIFEROL) 1.25 MG (50000 UNIT) PO CAPS: 50000.0000 [IU] | ORAL_CAPSULE | ORAL | 2 refills | Status: DC

## 2016-06-08 MED ORDER — ALPRAZOLAM 0.5 MG PO TABS: 0.5000 mg | ORAL_TABLET | Freq: Two times a day (BID) | ORAL | 5 refills | Status: DC | PRN

## 2016-06-08 MED ORDER — ESCITALOPRAM OXALATE 20 MG PO TABS: ORAL_TABLET | ORAL | 5 refills | Status: DC

## 2016-06-08 NOTE — Patient Instructions (Signed)
Vitamin D 2000 units per day or 10,000 units per week

## 2016-06-09 ENCOUNTER — Encounter: Payer: Self-pay | Admitting: Nurse Practitioner

## 2016-06-09 NOTE — Progress Notes (Signed)
Subjective:  Presents for routine follow-up of hypertension. Doing well on valsartan. Does not have any further cough. Has difficulty remembering to take Zocor in the evening. No chest pain/ischemic type pain or shortness of breath.  Objective:   BP 128/82   Ht 5\' 6"  (1.676 m)   Wt 206 lb 9.6 oz (93.7 kg)   BMI 33.35 kg/m  NAD. Alert, oriented. Lungs clear. Heart regular rate rhythm. Lower extremities trace pitting edema. Reviewed lab work with patient dated 1/9. Vitamin D level still slightly low. LDL 148.   Assessment:   Problem List Items Addressed This Visit      Cardiovascular and Mediastinum   Hypertension - Primary   Relevant Medications   hydrochlorothiazide (MICROZIDE) 12.5 MG capsule   valsartan (DIOVAN) 160 MG tablet     Other   Anxiety   Relevant Medications   ALPRAZolam (XANAX) 0.5 MG tablet   escitalopram (LEXAPRO) 20 MG tablet   traZODone (DESYREL) 50 MG tablet   Hyperlipidemia   Relevant Medications   hydrochlorothiazide (MICROZIDE) 12.5 MG capsule   valsartan (DIOVAN) 160 MG tablet   Vitamin D deficiency       Plan:  Meds ordered this encounter  Medications  . hydrochlorothiazide (MICROZIDE) 12.5 MG capsule    Sig: TAKE ONE (1) CAPSULE EACH DAY    Dispense:  30 capsule    Refill:  5    Order Specific Question:   Supervising Provider    Answer:   Mikey Kirschner [[id number]]  . valsartan (DIOVAN) 160 MG tablet    Sig: TAKE ONE (1) TABLET BY MOUTH EVERY DAY    Dispense:  30 tablet    Refill:  5    Needs office visit    Order Specific Question:   Supervising Provider    Answer:   Mikey Kirschner [[id number]]  . ALPRAZolam (XANAX) 0.5 MG tablet    Sig: Take 1 tablet (0.5 mg total) by mouth 2 (two) times daily as needed for anxiety.    Dispense:  60 tablet    Refill:  5    [facility]    Order Specific Question:   Supervising Provider    Answer:   Mikey Kirschner [[id number]]  . escitalopram (LEXAPRO) 20 MG tablet    Sig: TAKE ONE (1) TABLET EACH DAY     Dispense:  30 tablet    Refill:  5    Order Specific Question:   Supervising Provider    Answer:   Mikey Kirschner [[id number]]  . traZODone (DESYREL) 50 MG tablet    Sig: One po qhs for sleep    Dispense:  30 tablet    Refill:  5    Order Specific Question:   Supervising Provider    Answer:   Mikey Kirschner [[id number]]  . Vitamin D, Ergocalciferol, (DRISDOL) 50000 units CAPS capsule    Sig: Take 1 capsule (50,000 Units total) by mouth every 7 (seven) days.    Dispense:  4 capsule    Refill:  2    Order Specific Question:   Supervising Provider    Answer:   Mikey Kirschner [[id number]]   Take Zocor in the am with other meds to improve compliance. Has appointment with GYN for her physical. Repeat labs in 6 months.  Return in about 6 months (around 12/06/2016) for recheck.

## 2016-06-14 ENCOUNTER — Other Ambulatory Visit: Payer: BLUE CROSS/BLUE SHIELD | Admitting: Adult Health

## 2016-08-08 ENCOUNTER — Ambulatory Visit (INDEPENDENT_AMBULATORY_CARE_PROVIDER_SITE_OTHER): Payer: BLUE CROSS/BLUE SHIELD | Admitting: Adult Health

## 2016-08-08 ENCOUNTER — Encounter: Payer: Self-pay | Admitting: Adult Health

## 2016-08-08 ENCOUNTER — Other Ambulatory Visit (HOSPITAL_COMMUNITY)
Admission: RE | Admit: 2016-08-08 | Discharge: 2016-08-08 | Disposition: A | Discharge status: Home / Self Care | Payer: BLUE CROSS/BLUE SHIELD | Source: Ambulatory Visit | Attending: Adult Health | Admitting: Adult Health

## 2016-08-08 VITALS — BP 124/82 | Ht 66.75 in | Wt 204.0 lb

## 2016-08-08 DIAGNOSIS — Z01419 Encounter for gynecological examination (general) (routine) without abnormal findings: Secondary | ICD-10-CM | POA: Diagnosis not present

## 2016-08-08 DIAGNOSIS — Z113 Encounter for screening for infections with a predominantly sexual mode of transmission: Secondary | ICD-10-CM | POA: Diagnosis not present

## 2016-08-08 DIAGNOSIS — Z1212 Encounter for screening for malignant neoplasm of rectum: Secondary | ICD-10-CM

## 2016-08-08 DIAGNOSIS — N3946 Mixed incontinence: Secondary | ICD-10-CM | POA: Diagnosis not present

## 2016-08-08 DIAGNOSIS — Z1151 Encounter for screening for human papillomavirus (HPV): Secondary | ICD-10-CM | POA: Diagnosis not present

## 2016-08-08 DIAGNOSIS — Z01411 Encounter for gynecological examination (general) (routine) with abnormal findings: Secondary | ICD-10-CM

## 2016-08-08 DIAGNOSIS — Z1211 Encounter for screening for malignant neoplasm of colon: Secondary | ICD-10-CM | POA: Diagnosis not present

## 2016-08-08 DIAGNOSIS — N920 Excessive and frequent menstruation with regular cycle: Secondary | ICD-10-CM

## 2016-08-08 DIAGNOSIS — R102 Pelvic and perineal pain: Secondary | ICD-10-CM

## 2016-08-08 LAB — HEMOCCULT GUIAC POC 1CARD (OFFICE): FECAL OCCULT BLD: NEGATIVE

## 2016-08-08 NOTE — Progress Notes (Signed)
Patient ID: Shannon Ashley, female   DOB: 1965/10/21, 51 y.o.   MRN: [id number] History of Present Illness:  Shannon Ashley is a 51 year old black female in for well woman gyn exam and pap.She is having some cramping and spotting on and off and some after sex, too.And having urinary leakage if has to go and when coughs or sneezes.She is sp ablation and tubal.She has some hot flashes and night sweats.  Current Medications, Allergies, Past Medical History, Past Surgical History, Family History and Social History were reviewed in [employer] record.     Review of Systems: Patient denies any headaches, hearing loss, fatigue, blurred vision, shortness of breath, chest pain, abdominal pain, problems with bowel movements,or intercourse. No joint pain or mood swings.See HPI for positives.     Physical Exam: General:  Well developed, well nourished, no acute distress Skin:  Warm and dry Neck:  Midline trachea, normal thyroid, good ROM, no lymphadenopathy Lungs; Clear to auscultation bilaterally Breast:  No dominant palpable mass, retraction, or nipple discharge Cardiovascular: Regular rate and rhythm Abdomen:  Soft, non tender, no hepatosplenomegaly Pelvic:  External genitalia is normal in appearance, no lesions.  The vagina is normal in appearance. Urethra has no lesions or masses. The cervix is bulbous.Pap with HPV performed and GC/CHL obtained.  Uterus is felt to be normal size, shape, and contour.  No adnexal masses or tenderness noted.Bladder is non tender, no masses felt. Rectal: Good sphincter tone, no polyps, or hemorrhoids felt.  Hemoccult negative. Extremities/musculoskeletal:  No swelling or varicosities noted, no clubbing or cyanosis Psych:  No mood changes, alert and cooperative,seems happy PHQ 2 score 1. Will get [place] first, and also discussed HRT, risks and benefits.  Impression: 1. Encounter for routine gynecological examination with Papanicolaou smear of cervix   2. Pap  smear, as part of routine gynecological examination   3. Screening for colorectal cancer   4. Screening examination for STD (sexually transmitted disease)   5. Pelvic cramping   6. Spotting   7. Mixed stress and urge urinary incontinence       Plan: GC/CHL sent Return in 1 week for GYN [place] Physical in 1 year Pap in 3 if normal Mammogram yearly Labs with PCP Do kegels and try to lose 20 lbs Review handout on Kegel exercises

## 2016-08-08 NOTE — Patient Instructions (Signed)
Kegel Exercises Kegel exercises help strengthen the muscles that support the rectum, vagina, small intestine, bladder, and uterus. Doing Kegel exercises can help:  Improve bladder and bowel control.  Improve sexual response.  Reduce problems and discomfort during pregnancy. Kegel exercises involve squeezing your pelvic floor muscles, which are the same muscles you squeeze when you try to stop the flow of urine. The exercises can be done while sitting, standing, or lying down, but it is best to vary your position. Phase 1 exercises 1. Squeeze your pelvic floor muscles tight. You should feel a tight lift in your rectal area. If you are a female, you should also feel a tightness in your vaginal area. Keep your stomach, buttocks, and legs relaxed. 2. Hold the muscles tight for up to 10 seconds. 3. Relax your muscles. Repeat this exercise 50 times a day or as many times as told by your health care provider. Continue to do this exercise for at least 4-6 weeks or for as long as told by your health care provider. This information is not intended to replace advice given to you by your health care provider. Make sure you discuss any questions you have with your health care provider. Document Released: 04/30/2012 Document Revised: 01/07/2016 Document Reviewed: 04/03/2015 Elsevier Interactive Patient Education  2017 [employer].

## 2016-08-09 ENCOUNTER — Telehealth: Payer: Self-pay | Admitting: Adult Health

## 2016-08-09 LAB — CYTOLOGY - PAP
Adequacy: ABSENT
DIAGNOSIS: NEGATIVE
HPV: NOT DETECTED

## 2016-08-09 MED ORDER — METRONIDAZOLE 500 MG PO TABS: 500.0000 mg | ORAL_TABLET | Freq: Two times a day (BID) | ORAL | 0 refills | Status: DC

## 2016-08-09 NOTE — Telephone Encounter (Signed)
Pt aware pap negative for malignancy, with negative HPV,+ BV will rx flagyl

## 2016-08-10 LAB — GC/CHLAMYDIA PROBE AMP
Chlamydia trachomatis, NAA: NEGATIVE
Neisseria gonorrhoeae by PCR: NEGATIVE

## 2016-08-13 ENCOUNTER — Other Ambulatory Visit: Payer: Self-pay | Admitting: Adult Health

## 2016-08-13 DIAGNOSIS — N920 Excessive and frequent menstruation with regular cycle: Secondary | ICD-10-CM

## 2016-08-15 ENCOUNTER — Other Ambulatory Visit: Payer: BLUE CROSS/BLUE SHIELD

## 2016-08-16 DIAGNOSIS — B349 Viral infection, unspecified: Secondary | ICD-10-CM | POA: Diagnosis not present

## 2016-08-16 DIAGNOSIS — J069 Acute upper respiratory infection, unspecified: Secondary | ICD-10-CM | POA: Diagnosis not present

## 2016-08-16 DIAGNOSIS — J111 Influenza due to unidentified influenza virus with other respiratory manifestations: Secondary | ICD-10-CM | POA: Diagnosis not present

## 2016-08-20 ENCOUNTER — Encounter: Payer: Self-pay | Admitting: *Deleted

## 2016-08-20 ENCOUNTER — Other Ambulatory Visit: Payer: BLUE CROSS/BLUE SHIELD

## 2016-12-06 ENCOUNTER — Ambulatory Visit (INDEPENDENT_AMBULATORY_CARE_PROVIDER_SITE_OTHER): Payer: BLUE CROSS/BLUE SHIELD | Admitting: Nurse Practitioner

## 2016-12-06 ENCOUNTER — Encounter: Payer: Self-pay | Admitting: Nurse Practitioner

## 2016-12-06 VITALS — BP 142/80 | Ht 67.0 in | Wt 203.0 lb

## 2016-12-06 DIAGNOSIS — E785 Hyperlipidemia, unspecified: Secondary | ICD-10-CM

## 2016-12-06 DIAGNOSIS — Z79899 Other long term (current) drug therapy: Secondary | ICD-10-CM | POA: Diagnosis not present

## 2016-12-06 DIAGNOSIS — I10 Essential (primary) hypertension: Secondary | ICD-10-CM

## 2016-12-06 DIAGNOSIS — F419 Anxiety disorder, unspecified: Secondary | ICD-10-CM

## 2016-12-06 MED ORDER — ALPRAZOLAM 0.5 MG PO TABS: 0.5000 mg | ORAL_TABLET | Freq: Two times a day (BID) | ORAL | 5 refills | Status: DC | PRN

## 2016-12-06 MED ORDER — MELOXICAM 15 MG PO TABS: 15.0000 mg | ORAL_TABLET | Freq: Every day | ORAL | 0 refills | Status: DC

## 2016-12-07 ENCOUNTER — Encounter: Payer: Self-pay | Admitting: Nurse Practitioner

## 2016-12-07 NOTE — Progress Notes (Signed)
Subjective:  Presents for recheck on her hypertension. No chest pain/ischemic type pain or shortness of breath. BP outside the office has been running very well, 117/83 last night. Mainly has elevations in her blood pressure and stress related to her job. Also has family stress related to her father's illness. Overall Lexapro is working well, takes Xanax as well for her stress and anxiety.  Objective:   BP (!) 142/80   Ht 5\' 7"  (1.702 m)   Wt 203 lb (92.1 kg)   BMI 31.79 kg/m  NAD. Alert, oriented. Mildly fatigued in appearance. Lungs clear. Heart regular rate rhythm. Lower extremities no edema.  Assessment:   Problem List Items Addressed This Visit      Cardiovascular and Mediastinum   Hypertension - Primary     Other   Anxiety   Relevant Medications   ALPRAZolam (XANAX) 0.5 MG tablet   Hyperlipidemia   Relevant Orders   Lipid panel    Other Visit Diagnoses    High risk medication use       Relevant Orders   Hepatic function panel       Plan:     Meds ordered this encounter  Medications  . ALPRAZolam (XANAX) 0.5 MG tablet    Sig: Take 1 tablet (0.5 mg total) by mouth 2 (two) times daily as needed for anxiety.    Dispense:  60 tablet    Refill:  5    [facility]    Order Specific Question:   Supervising Provider    Answer:   Mikey Kirschner [[id number]]  . meloxicam (MOBIC) 15 MG tablet    Sig: Take 1 tablet (15 mg total) by mouth daily. Prn ankle pain    Dispense:  30 tablet    Refill:  0    Order Specific Question:   Supervising Provider    Answer:   Mikey Kirschner [[id number]]   Continue current medications as directed. Discussed importance of stress reduction. Routine lab work pending. [facility] Return in about 6 months (around 06/08/2017) for recheck.

## 2016-12-11 ENCOUNTER — Other Ambulatory Visit: Payer: Self-pay | Admitting: *Deleted

## 2016-12-11 MED ORDER — LOSARTAN POTASSIUM 100 MG PO TABS: 100.0000 mg | ORAL_TABLET | Freq: Every day | ORAL | 5 refills | Status: DC

## 2017-02-27 ENCOUNTER — Other Ambulatory Visit: Payer: Self-pay | Admitting: Nurse Practitioner

## 2017-05-09 ENCOUNTER — Other Ambulatory Visit: Payer: Self-pay | Admitting: Nurse Practitioner

## 2017-06-24 ENCOUNTER — Other Ambulatory Visit: Payer: Self-pay | Admitting: Nurse Practitioner

## 2017-06-24 ENCOUNTER — Telehealth: Payer: Self-pay | Admitting: Nurse Practitioner

## 2017-06-24 DIAGNOSIS — E785 Hyperlipidemia, unspecified: Secondary | ICD-10-CM

## 2017-06-24 DIAGNOSIS — Z79899 Other long term (current) drug therapy: Secondary | ICD-10-CM

## 2017-06-24 DIAGNOSIS — E559 Vitamin D deficiency, unspecified: Secondary | ICD-10-CM

## 2017-06-24 DIAGNOSIS — I10 Essential (primary) hypertension: Secondary | ICD-10-CM

## 2017-06-24 NOTE — Telephone Encounter (Signed)
Pt is needing lab orders to be reordered for an upcoming appt. Last labs per epic were: vit d.bmp,hepatic,and lipid on 06/04/2016

## 2017-06-25 NOTE — Telephone Encounter (Signed)
Orders put in. Pt notified.  

## 2017-06-25 NOTE — Telephone Encounter (Signed)
Please order same labs as 06/04/16. Routine labs. Thanks.

## 2017-07-06 DIAGNOSIS — E785 Hyperlipidemia, unspecified: Secondary | ICD-10-CM | POA: Diagnosis not present

## 2017-07-06 DIAGNOSIS — E559 Vitamin D deficiency, unspecified: Secondary | ICD-10-CM | POA: Diagnosis not present

## 2017-07-06 DIAGNOSIS — Z79899 Other long term (current) drug therapy: Secondary | ICD-10-CM | POA: Diagnosis not present

## 2017-07-06 DIAGNOSIS — I10 Essential (primary) hypertension: Secondary | ICD-10-CM | POA: Diagnosis not present

## 2017-07-08 ENCOUNTER — Encounter: Payer: Self-pay | Admitting: Nurse Practitioner

## 2017-07-08 ENCOUNTER — Ambulatory Visit: Payer: BLUE CROSS/BLUE SHIELD | Admitting: Nurse Practitioner

## 2017-07-08 VITALS — BP 166/90 | Ht 67.0 in | Wt 210.4 lb

## 2017-07-08 DIAGNOSIS — I10 Essential (primary) hypertension: Secondary | ICD-10-CM | POA: Diagnosis not present

## 2017-07-08 DIAGNOSIS — E785 Hyperlipidemia, unspecified: Secondary | ICD-10-CM | POA: Diagnosis not present

## 2017-07-08 LAB — HEPATIC FUNCTION PANEL
ALBUMIN: 4.6 g/dL (ref 3.5–5.5)
ALT: 29 IU/L (ref 0–32)
AST: 21 IU/L (ref 0–40)
Alkaline Phosphatase: 63 IU/L (ref 39–117)
BILIRUBIN TOTAL: 0.3 mg/dL (ref 0.0–1.2)
BILIRUBIN, DIRECT: 0.1 mg/dL (ref 0.00–0.40)
Total Protein: 7.4 g/dL (ref 6.0–8.5)

## 2017-07-08 LAB — LIPID PANEL
CHOLESTEROL TOTAL: 247 mg/dL — AB (ref 100–199)
Chol/HDL Ratio: 4.4 ratio (ref 0.0–4.4)
HDL: 56 mg/dL (ref >39)
LDL Calculated: 177 mg/dL — ABNORMAL HIGH (ref 0–99)
TRIGLYCERIDES: 72 mg/dL (ref 0–149)
VLDL Cholesterol Cal: 14 mg/dL (ref 5–40)

## 2017-07-08 LAB — BASIC METABOLIC PANEL
BUN / CREAT RATIO: 11 (ref 9–23)
BUN: 10 mg/dL (ref 6–24)
CALCIUM: 9.3 mg/dL (ref 8.7–10.2)
CHLORIDE: 104 mmol/L (ref 96–106)
CO2: 21 mmol/L (ref 20–29)
Creatinine, Ser: 0.88 mg/dL (ref 0.57–1.00)
GFR, EST AFRICAN AMERICAN: 89 mL/min/{1.73_m2} (ref >59)
GFR, EST NON AFRICAN AMERICAN: 77 mL/min/{1.73_m2} (ref >59)
Glucose: 83 mg/dL (ref 65–99)
POTASSIUM: 4.3 mmol/L (ref 3.5–5.2)
Sodium: 140 mmol/L (ref 134–144)

## 2017-07-08 LAB — VITAMIN D 25 HYDROXY (VIT D DEFICIENCY, FRACTURES): Vit D, 25-Hydroxy: 30.5 ng/mL (ref 30.0–100.0)

## 2017-07-08 MED ORDER — ROSUVASTATIN CALCIUM 5 MG PO TABS: 5.0000 mg | ORAL_TABLET | Freq: Every day | ORAL | 2 refills | Status: DC

## 2017-07-08 MED ORDER — ROSUVASTATIN CALCIUM 5 MG PO TABS: 5.0000 mg | ORAL_TABLET | Freq: Every day | ORAL | 3 refills | Status: DC

## 2017-07-08 MED ORDER — HYDROCHLOROTHIAZIDE 25 MG PO TABS: 25.0000 mg | ORAL_TABLET | Freq: Every day | ORAL | 2 refills | Status: DC

## 2017-07-08 NOTE — Progress Notes (Signed)
lipid

## 2017-07-09 ENCOUNTER — Encounter: Payer: Self-pay | Admitting: Nurse Practitioner

## 2017-07-09 NOTE — Progress Notes (Signed)
Subjective:  Presents for recheck on chronic health issues. Has been out of her cholesterol meds for a few weeks. Has been taking BP meds. Eats a very healthy low fat diet. Plans to join the [facility] for activity. No CP/ischemic type pain or SOB. No numbness or weakness of the face, arms or legs. No difficulty speaking or swallowing. No visual changes. Strong fm hx of HTN and high cholesterol. Takes daily vitamin D supplement.   Objective:   BP (!) 142/96   Ht 5\' 7"  (1.702 m)   Wt 210 lb 6.4 oz (95.4 kg)   BMI 32.95 kg/m  NAD. Alert, oriented. Lungs clear. Heart RRR. Carotids no bruits or thrills. BP on recheck right arm sitting 166/90. LE: no edema.  Results for orders placed or performed in visit on 06/24/17  Lipid panel  Result Value Ref Range   Cholesterol, Total 247 (H) 100 - 199 mg/dL   Triglycerides 72 0 - 149 mg/dL   HDL 56 >39 mg/dL   VLDL Cholesterol Cal 14 5 - 40 mg/dL   LDL Calculated 177 (H) 0 - 99 mg/dL   Chol/HDL Ratio 4.4 0.0 - 4.4 ratio  Hepatic function panel  Result Value Ref Range   Total Protein 7.4 6.0 - 8.5 g/dL   Albumin 4.6 3.5 - 5.5 g/dL   Bilirubin Total 0.3 0.0 - 1.2 mg/dL   Bilirubin, Direct 0.10 0.00 - 0.40 mg/dL   Alkaline Phosphatase 63 39 - 117 IU/L   AST 21 0 - 40 IU/L   ALT 29 0 - 32 IU/L  Basic metabolic panel  Result Value Ref Range   Glucose 83 65 - 99 mg/dL   BUN 10 6 - 24 mg/dL   Creatinine, Ser 0.88 0.57 - 1.00 mg/dL   GFR calc non Af Amer 77 >59 mL/min/1.73   GFR calc Af Amer 89 >59 mL/min/1.73   BUN/Creatinine Ratio 11 9 - 23   Sodium 140 134 - 144 mmol/L   Potassium 4.3 3.5 - 5.2 mmol/L   Chloride 104 96 - 106 mmol/L   CO2 21 20 - 29 mmol/L   Calcium 9.3 8.7 - 10.2 mg/dL  VITAMIN D 25 Hydroxy (Vit-D Deficiency, Fractures)  Result Value Ref Range   Vit D, 25-Hydroxy 30.5 30.0 - 100.0 ng/mL     Assessment:   Problem List Items Addressed This Visit      Cardiovascular and Mediastinum   Hypertension - Primary   Relevant  Medications   rosuvastatin (CRESTOR) 5 MG tablet   hydrochlorothiazide (HYDRODIURIL) 25 MG tablet     Other   Hyperlipidemia   Relevant Medications   rosuvastatin (CRESTOR) 5 MG tablet   hydrochlorothiazide (HYDRODIURIL) 25 MG tablet   Other Relevant Orders   Lipid Profile   Hepatic function panel       Plan:   Meds ordered this encounter  Medications  . DISCONTD: rosuvastatin (CRESTOR) 5 MG tablet    Sig: Take 1 tablet (5 mg total) by mouth daily.    Dispense:  90 tablet    Refill:  3    Order Specific Question:   Supervising Provider    Answer:   Mikey Kirschner [[id number]]  . rosuvastatin (CRESTOR) 5 MG tablet    Sig: Take 1 tablet (5 mg total) by mouth daily. For cholesterol    Dispense:  30 tablet    Refill:  2    Order Specific Question:   Supervising Provider    Answer:   Baltazar Apo  S [[id number]]  . hydrochlorothiazide (HYDRODIURIL) 25 MG tablet    Sig: Take 1 tablet (25 mg total) by mouth daily.    Dispense:  30 tablet    Refill:  2    Order Specific Question:   Supervising Provider    Answer:   Mikey Kirschner [[id number]]   Switch to generic Crestor since patient was not at goal when she was taking Zocor. Increase HCTZ to 25 mg.  Discussed lifestyle factors affecting BP. Encouraged regular activity and stress reduction. Continue to monitor BP outside of the office and call back if remains elevated.  Repeat labs in 8-10 weeks.  Return in about 3 months (around 10/05/2017). Call back sooner if any problems.

## 2017-07-25 ENCOUNTER — Other Ambulatory Visit: Payer: Self-pay | Admitting: Family Medicine

## 2017-07-25 ENCOUNTER — Other Ambulatory Visit: Payer: Self-pay | Admitting: Nurse Practitioner

## 2017-08-22 ENCOUNTER — Other Ambulatory Visit: Payer: Self-pay | Admitting: Nurse Practitioner

## 2017-08-28 ENCOUNTER — Encounter: Payer: Self-pay | Admitting: Internal Medicine

## 2017-10-09 ENCOUNTER — Ambulatory Visit: Payer: BLUE CROSS/BLUE SHIELD | Admitting: Nurse Practitioner

## 2017-11-02 ENCOUNTER — Other Ambulatory Visit: Payer: Self-pay | Admitting: Nurse Practitioner

## 2017-12-21 ENCOUNTER — Other Ambulatory Visit: Payer: Self-pay | Admitting: Nurse Practitioner

## 2018-02-04 DIAGNOSIS — H35033 Hypertensive retinopathy, bilateral: Secondary | ICD-10-CM | POA: Diagnosis not present

## 2018-02-04 DIAGNOSIS — H524 Presbyopia: Secondary | ICD-10-CM | POA: Diagnosis not present

## 2018-02-21 ENCOUNTER — Telehealth: Payer: Self-pay | Admitting: Family Medicine

## 2018-02-21 ENCOUNTER — Other Ambulatory Visit: Payer: Self-pay | Admitting: Family Medicine

## 2018-02-21 NOTE — Telephone Encounter (Signed)
Pharmacy requesting refill on Xanax 0.5 mg. Take one tablet by mouth twice a day prn anxiety.

## 2018-02-22 NOTE — Telephone Encounter (Signed)
Ok times one/ on rx write six month follow up ovdrdue

## 2018-02-24 NOTE — Telephone Encounter (Signed)
This was already done

## 2018-02-24 NOTE — Telephone Encounter (Signed)
Prescription called into [facility]. 

## 2018-03-18 ENCOUNTER — Other Ambulatory Visit: Payer: BLUE CROSS/BLUE SHIELD | Admitting: Adult Health

## 2018-03-24 ENCOUNTER — Other Ambulatory Visit: Payer: BLUE CROSS/BLUE SHIELD | Admitting: Family Medicine

## 2018-04-21 ENCOUNTER — Ambulatory Visit: Payer: BLUE CROSS/BLUE SHIELD | Admitting: Family Medicine

## 2018-04-21 DIAGNOSIS — E785 Hyperlipidemia, unspecified: Secondary | ICD-10-CM | POA: Diagnosis not present

## 2018-04-22 LAB — LIPID PANEL
CHOLESTEROL TOTAL: 217 mg/dL — AB (ref 100–199)
Chol/HDL Ratio: 3.5 ratio (ref 0.0–4.4)
HDL: 62 mg/dL (ref >39)
LDL Calculated: 139 mg/dL — ABNORMAL HIGH (ref 0–99)
TRIGLYCERIDES: 82 mg/dL (ref 0–149)
VLDL Cholesterol Cal: 16 mg/dL (ref 5–40)

## 2018-04-22 LAB — HEPATIC FUNCTION PANEL
ALK PHOS: 57 IU/L (ref 39–117)
ALT: 35 IU/L — ABNORMAL HIGH (ref 0–32)
AST: 23 IU/L (ref 0–40)
Albumin: 4.2 g/dL (ref 3.5–5.5)
BILIRUBIN, DIRECT: 0.07 mg/dL (ref 0.00–0.40)
Total Protein: 6.8 g/dL (ref 6.0–8.5)

## 2018-04-23 ENCOUNTER — Ambulatory Visit: Payer: BLUE CROSS/BLUE SHIELD | Admitting: Family Medicine

## 2018-04-23 ENCOUNTER — Encounter: Payer: Self-pay | Admitting: Family Medicine

## 2018-04-23 VITALS — BP 148/88 | Ht 67.0 in | Wt 206.6 lb

## 2018-04-23 DIAGNOSIS — E785 Hyperlipidemia, unspecified: Secondary | ICD-10-CM | POA: Diagnosis not present

## 2018-04-23 DIAGNOSIS — F320 Major depressive disorder, single episode, mild: Secondary | ICD-10-CM | POA: Diagnosis not present

## 2018-04-23 DIAGNOSIS — I10 Essential (primary) hypertension: Secondary | ICD-10-CM

## 2018-04-23 DIAGNOSIS — F419 Anxiety disorder, unspecified: Secondary | ICD-10-CM | POA: Diagnosis not present

## 2018-04-23 MED ORDER — ROSUVASTATIN CALCIUM 5 MG PO TABS: 5.0000 mg | ORAL_TABLET | Freq: Every day | ORAL | 5 refills | Status: DC

## 2018-04-23 MED ORDER — ALPRAZOLAM 0.5 MG PO TABS: 0.5000 mg | ORAL_TABLET | Freq: Two times a day (BID) | ORAL | 3 refills | Status: DC | PRN

## 2018-04-23 MED ORDER — LOSARTAN POTASSIUM 100 MG PO TABS: 100.0000 mg | ORAL_TABLET | Freq: Every day | ORAL | 5 refills | Status: DC

## 2018-04-23 MED ORDER — TRAZODONE HCL 50 MG PO TABS: ORAL_TABLET | ORAL | 5 refills | Status: DC

## 2018-04-23 MED ORDER — INDAPAMIDE 2.5 MG PO TABS: 2.5000 mg | ORAL_TABLET | Freq: Every day | ORAL | 5 refills | Status: DC

## 2018-04-23 MED ORDER — ESCITALOPRAM OXALATE 20 MG PO TABS: ORAL_TABLET | ORAL | 5 refills | Status: DC

## 2018-04-23 NOTE — Progress Notes (Signed)
Subjective:    Patient ID: Shannon Ashley, female    DOB: 11-18-1965, 52 y.o.   MRN: [id number]  Hypertension  This is a chronic problem. The current episode started more than 1 year ago. Pertinent negatives include no chest pain, headaches, palpitations or shortness of breath. Risk factors for coronary artery disease include dyslipidemia. Treatments tried: cozaar, hctz. There are no compliance problems.    Reports compliance with BP medications. No adverse effects. Reports home BPs running 140/90.    Hyperlipidemia: reports forgets to take sometimes. Working on diet, decreasing fried foods. Needs to work on increasing exercise.    Discuss recent lab work  Depression and anxiety: Feels like lexapro is helpful, taking everyday. Denies SI/HI. Taking xanax at least once a day, sometimes bid for anxiety and feeling overwhelmed, takes bid about 1/2 the time. Denies drowsiness, but avoids taking when she needs to drive. Reports increased stress with going back to school.  Trazodone taking almost every night to help with sleep.   Past Medical History:  Diagnosis Date  . Anxiety   . Hyperlipidemia   . Hypertension    Allergies  Allergen Reactions  . Lisinopril Cough    Current Outpatient Medications:  .  ALPRAZolam (XANAX) 0.5 MG tablet, Take 1 tablet (0.5 mg total) by mouth 2 (two) times daily as needed. for anxiety, Disp: 60 tablet, Rfl: 3 .  cetirizine (ZYRTEC) 10 MG tablet, Take 10 mg by mouth as needed for allergies., Disp: , Rfl:  .  escitalopram (LEXAPRO) 20 MG tablet, TAKE ONE (1) TABLET BY MOUTH EVERY DAY, Disp: 30 tablet, Rfl: 5 .  fluticasone (FLONASE) 50 MCG/ACT nasal spray, USE TWO SPRAYS IN BOTH NOSTRILS DAILY, Disp: 16 g, Rfl: 11 .  indapamide (LOZOL) 2.5 MG tablet, Take 1 tablet (2.5 mg total) by mouth daily., Disp: 30 tablet, Rfl: 5 .  losartan (COZAAR) 100 MG tablet, Take 1 tablet (100 mg total) by mouth daily., Disp: 30 tablet, Rfl: 5 .  meloxicam (MOBIC) 15 MG  tablet, Take 1 tablet (15 mg total) by mouth daily. Prn ankle pain, Disp: 30 tablet, Rfl: 0 .  rosuvastatin (CRESTOR) 5 MG tablet, Take 1 tablet (5 mg total) by mouth daily. For cholesterol, Disp: 30 tablet, Rfl: 5 .  traZODone (DESYREL) 50 MG tablet, TAKE ONE TABLET BY MOUTH EVERY NIGHT AT BEDTIME FOR SLEEP, Disp: 30 tablet, Rfl: 5   Review of Systems  Constitutional: Negative for activity change, appetite change and unexpected weight change.  Eyes: Negative for visual disturbance.  Respiratory: Negative for shortness of breath.   Cardiovascular: Negative for chest pain, palpitations and leg swelling.  Musculoskeletal: Negative for myalgias.  Neurological: Negative for syncope, weakness and headaches.       Objective:   Physical Exam  Constitutional: She is oriented to person, place, and time. She appears well-developed and well-nourished. No distress.  HENT:  Head: Normocephalic and atraumatic.  Neck: Neck supple.  Cardiovascular: Normal rate, regular rhythm and normal heart sounds.  No murmur heard. Pulmonary/Chest: Effort normal and breath sounds normal. No respiratory distress.  Neurological: She is alert and oriented to person, place, and time.  Skin: Skin is warm and dry.  Psychiatric: She has a normal mood and affect.  Nursing note and vitals reviewed.   Depression screen [facility] 2/9 04/23/2018 08/08/2016  Decreased Interest 1 0  Down, Depressed, Hopeless 1 1  PHQ - 2 Score 2 1  Altered sleeping 0 -  Tired, decreased energy 2 -  Change in appetite 0 -  Feeling bad or failure about yourself  0 -  Trouble concentrating 1 -  Moving slowly or fidgety/restless 0 -  Suicidal thoughts 0 -  PHQ-9 Score 5 -  Difficult doing work/chores Somewhat difficult -   Results for orders placed or performed in visit on 07/08/17  Lipid Profile  Result Value Ref Range   Cholesterol, Total 217 (H) 100 - 199 mg/dL   Triglycerides 82 0 - 149 mg/dL   HDL 62 >39 mg/dL   VLDL Cholesterol Cal  16 5 - 40 mg/dL   LDL Calculated 139 (H) 0 - 99 mg/dL   Chol/HDL Ratio 3.5 0.0 - 4.4 ratio  Hepatic function panel  Result Value Ref Range   Total Protein 6.8 6.0 - 8.5 g/dL   Albumin 4.2 3.5 - 5.5 g/dL   Bilirubin Total <0.2 0.0 - 1.2 mg/dL   Bilirubin, Direct 0.07 0.00 - 0.40 mg/dL   Alkaline Phosphatase 57 39 - 117 IU/L   AST 23 0 - 40 IU/L   ALT 35 (H) 0 - 32 IU/L        Assessment & Plan:  1. Hypertension, unspecified type - Plan: Basic metabolic panel Blood pressure under subpar control currently.  Recommend changing hydrochlorothiazide to Lozol daily.  Continue with the losartan daily.  Patient will keep track of her blood pressure readings over the next few weeks and give [place] an update in about a month or so.  Goal is less than 130/80.  We will recheck her metabolic panel in 4 to 6 weeks as well.  Warning signs discussed.  Keep regular scheduled follow-up in 6 months.   2. Hyperlipidemia, unspecified hyperlipidemia type Lipid and liver panel results reviewed with patient.  Cholesterol levels have decreased the LDL and total cholesterol still remain elevated.  Recommend continued use of Crestor.  Refills given.  Work on [employer] and exercise.  ALT slightly elevated, will continue to monitor.  Recommend to avoid excessive use of Tylenol or alcohol.  3. Anxiety Anxiety under good control with daily use of Xanax.  Patient reports twice daily use about half the days.  She knows not to combine this with her trazodone or to take when she needs to drive or operate machinery.  Patient verbalized understanding.  PMP database checked, will refill.  4. Depression, major, single episode, mild ([place]) Doing well on current Celexa.  Patient would like to continue.  Will refill.  She should follow-up in 6 months.  Dr. Sallee Lange was consulted on this case and is in agreement with the above treatment plan.  25 minutes was spent with the patient.  This statement verifies that 25 minutes was  indeed spent with the patient.  More than 50% of this visit-total duration of the visit-was spent in counseling and coordination of care. The issues that the patient came in for today as reflected in the diagnosis (s) please refer to documentation for further details.

## 2018-07-10 ENCOUNTER — Telehealth: Payer: Self-pay | Admitting: Internal Medicine

## 2018-07-10 ENCOUNTER — Other Ambulatory Visit (HOSPITAL_COMMUNITY): Payer: Self-pay | Admitting: Adult Health

## 2018-07-10 DIAGNOSIS — Z1231 Encounter for screening mammogram for malignant neoplasm of breast: Secondary | ICD-10-CM

## 2018-07-10 NOTE — Telephone Encounter (Signed)
Pt is aware of OV °

## 2018-07-10 NOTE — Telephone Encounter (Signed)
Pt needs ov due to meds, etoh and RMR had to use an increased amount of sedation with last tcs.

## 2018-07-10 NOTE — Telephone Encounter (Signed)
Pt is past due on her tcs. Last one done in 2016 and was suppose to have another in 3 years. Please advise if she needs an OV or NV. [phone]

## 2018-07-11 ENCOUNTER — Ambulatory Visit (INDEPENDENT_AMBULATORY_CARE_PROVIDER_SITE_OTHER): Payer: BLUE CROSS/BLUE SHIELD | Admitting: Adult Health

## 2018-07-11 ENCOUNTER — Encounter (HOSPITAL_COMMUNITY): Payer: Self-pay

## 2018-07-11 ENCOUNTER — Encounter: Payer: Self-pay | Admitting: Adult Health

## 2018-07-11 ENCOUNTER — Ambulatory Visit (HOSPITAL_COMMUNITY)
Admission: RE | Admit: 2018-07-11 | Discharge: 2018-07-11 | Disposition: A | Discharge status: Home / Self Care | Payer: BLUE CROSS/BLUE SHIELD | Source: Ambulatory Visit | Attending: Adult Health | Admitting: Adult Health

## 2018-07-11 ENCOUNTER — Other Ambulatory Visit (HOSPITAL_COMMUNITY)
Admission: RE | Admit: 2018-07-11 | Discharge: 2018-07-11 | Disposition: A | Discharge status: Home / Self Care | Payer: BLUE CROSS/BLUE SHIELD | Source: Ambulatory Visit | Attending: Adult Health | Admitting: Adult Health

## 2018-07-11 VITALS — BP 130/85 | HR 76 | Ht 66.5 in | Wt 205.5 lb

## 2018-07-11 DIAGNOSIS — Z780 Asymptomatic menopausal state: Secondary | ICD-10-CM

## 2018-07-11 DIAGNOSIS — Z01419 Encounter for gynecological examination (general) (routine) without abnormal findings: Secondary | ICD-10-CM | POA: Diagnosis not present

## 2018-07-11 DIAGNOSIS — Z1212 Encounter for screening for malignant neoplasm of rectum: Secondary | ICD-10-CM | POA: Diagnosis not present

## 2018-07-11 DIAGNOSIS — Z1211 Encounter for screening for malignant neoplasm of colon: Secondary | ICD-10-CM | POA: Insufficient documentation

## 2018-07-11 DIAGNOSIS — Z1231 Encounter for screening mammogram for malignant neoplasm of breast: Secondary | ICD-10-CM | POA: Diagnosis not present

## 2018-07-11 DIAGNOSIS — Z113 Encounter for screening for infections with a predominantly sexual mode of transmission: Secondary | ICD-10-CM | POA: Diagnosis not present

## 2018-07-11 DIAGNOSIS — Z5320 Procedure and treatment not carried out because of patient's decision for unspecified reasons: Secondary | ICD-10-CM | POA: Insufficient documentation

## 2018-07-11 LAB — HEMOCCULT GUIAC POC 1CARD (OFFICE): Fecal Occult Blood, POC: NEGATIVE

## 2018-07-11 NOTE — Patient Instructions (Signed)
Menopause  Menopause is the normal time of life when menstrual periods stop completely. It is usually confirmed by 12 months without a menstrual period. The transition to menopause (perimenopause) most often happens between the ages of 45 and 55. During perimenopause, hormone levels change in your body, which can cause symptoms and affect your health. Menopause may increase your risk for:   Loss of bone (osteoporosis), which causes bone breaks (fractures).   Depression.   Hardening and narrowing of the arteries (atherosclerosis), which can cause heart attacks and strokes.  What are the causes?  This condition is usually caused by a natural change in hormone levels that happens as you get older. The condition may also be caused by surgery to remove both ovaries (bilateral oophorectomy).  What increases the risk?  This condition is more likely to start at an earlier age if you have certain medical conditions or treatments, including:   A tumor of the pituitary gland in the brain.   A disease that affects the ovaries and hormone production.   Radiation treatment for cancer.   Certain cancer treatments, such as chemotherapy or hormone (anti-estrogen) therapy.   Heavy smoking and excessive alcohol use.   Family history of early menopause.  This condition is also more likely to develop earlier in women who are very thin.  What are the signs or symptoms?  Symptoms of this condition include:   Hot flashes.   Irregular menstrual periods.   Night sweats.   Changes in feelings about sex. This could be a decrease in sex drive or an increased comfort around your sexuality.   Vaginal dryness and thinning of the vaginal walls. This may cause painful intercourse.   Dryness of the skin and development of wrinkles.   Headaches.   Problems sleeping (insomnia).   Mood swings or irritability.   Memory problems.   Weight gain.   Hair growth on the face and chest.   Bladder infections or problems with urinating.  How  is this diagnosed?  This condition is diagnosed based on your medical history, a physical exam, your age, your menstrual history, and your symptoms. Hormone tests may also be done.  How is this treated?  In some cases, no treatment is needed. You and your health care provider should make a decision together about whether treatment is necessary. Treatment will be based on your individual condition and preferences. Treatment for this condition focuses on managing symptoms. Treatment may include:   Menopausal hormone therapy (MHT).   Medicines to treat specific symptoms or complications.   Acupuncture.   Vitamin or herbal supplements.  Before starting treatment, make sure to let your health care provider know if you have a personal or family history of:   Heart disease.   Breast cancer.   Blood clots.   Diabetes.   Osteoporosis.  Follow these instructions at home:  Lifestyle   Do not use any products that contain nicotine or tobacco, such as cigarettes and e-cigarettes. If you need help quitting, ask your health care provider.   Get at least 30 minutes of physical activity on 5 or more days each week.   Avoid alcoholic and caffeinated beverages, as well as spicy foods. This may help prevent hot flashes.   Get 7-8 hours of sleep each night.   If you have hot flashes, try:  ? Dressing in layers.  ? Avoiding things that may trigger hot flashes, such as spicy food, warm places, or stress.  ? Taking slow, deep   breaths when a hot flash starts.  ? Keeping a fan in your home and office.   Find ways to manage stress, such as deep breathing, meditation, or journaling.   Consider going to group therapy with other women who are having menopause symptoms. Ask your health care provider about recommended group therapy meetings.  Eating and drinking   Eat a healthy, balanced diet that contains whole grains, lean protein, low-fat dairy, and plenty of fruits and vegetables.   Your health care provider may recommend  adding more soy to your diet. Foods that contain soy include tofu, tempeh, and soy milk.   Eat plenty of foods that contain calcium and vitamin D for bone health. Items that are rich in calcium include low-fat milk, yogurt, beans, almonds, sardines, broccoli, and kale.  Medicines   Take over-the-counter and prescription medicines only as told by your health care provider.   Talk with your health care provider before starting any herbal supplements. If prescribed, take vitamins and supplements as told by your health care provider. These may include:  ? Calcium. Women age 51 and older should get 1,200 mg (milligrams) of calcium every day.  ? Vitamin D. Women need 600-800 International Units of vitamin D each day.  ? Vitamins B12 and B6. Aim for 50 micrograms of B12 and 1.5 mg of B6 each day.  General instructions   Keep track of your menstrual periods, including:  ? When they occur.  ? How heavy they are and how long they last.  ? How much time passes between periods.   Keep track of your symptoms, noting when they start, how often you have them, and how long they last.   Use vaginal lubricants or moisturizers to help with vaginal dryness and improve comfort during sex.   Keep all follow-up visits as told by your health care provider. This is important. This includes any group therapy or counseling.  Contact a health care provider if:   You are still having menstrual periods after age 55.   You have pain during sex.   You have not had a period for 12 months and you develop vaginal bleeding.  Get help right away if:   You have:  ? Severe depression.  ? Excessive vaginal bleeding.  ? Pain when you urinate.  ? A fast or irregular heart beat (palpitations).  ? Severe headaches.  ? Abdomen (abdominal) pain or severe indigestion.   You fell and you think you have a broken bone.   You develop leg or chest pain.   You develop vision problems.   You feel a lump in your breast.  Summary   Menopause is the normal  time of life when menstrual periods stop completely. It is usually confirmed by 12 months without a menstrual period.   The transition to menopause (perimenopause) most often happens between the ages of 45 and 55.   Symptoms can be managed through medicines, lifestyle changes, and complementary therapies such as acupuncture.   Eat a balanced diet that is rich in nutrients to promote bone health and heart health and to manage symptoms during menopause.  This information is not intended to replace advice given to you by your health care provider. Make sure you discuss any questions you have with your health care provider.  Document Released: 08/04/2003 Document Revised: 06/16/2016 Document Reviewed: 06/16/2016  Elsevier Interactive Patient Education  2019 [employer] Inc[employer]

## 2018-07-11 NOTE — Progress Notes (Signed)
Patient ID: DESTONY PREVOST, female   DOB: 1966/03/14, 53 y.o.   MRN: [id number] History of Present Illness: Tyrika is a 53 year old black female, separated, G3P2 in for a well woman gyn exam and pap. PCP is Dr Mickie Hillier.   Current Medications, Allergies, Past Medical History, Past Surgical History, Family History and Social History were reviewed in [employer] record.     Review of Systems: Patient denies any headaches, hearing loss, fatigue, blurred vision, shortness of breath, chest pain, abdominal pain, problems with bowel movements, urination, or intercourse. No joint pain or mood swings. +hot flashes and night sweats May spot occasionally she had ablation years ago    Physical Exam:BP 130/85 (BP Location: Left Arm, Patient Position: Sitting, Cuff Size: Large)   Pulse 76   Ht 5' 6.5" (1.689 m)   Wt 205 lb 8 oz (93.2 kg)   BMI 32.67 kg/m  General:  Well developed, well nourished, no acute distress Skin:  Warm and dry Neck:  Midline trachea, normal thyroid, good ROM, no lymphadenopathy Lungs; Clear to auscultation bilaterally Breast:  No dominant palpable mass, retraction, or nipple discharge Cardiovascular: Regular rate and rhythm Abdomen:  Soft, non tender, no hepatosplenomegaly Pelvic:  External genitalia is normal in appearance, no lesions.  The vagina is normal in appearance,pink discharge . Urethra has no lesions or masses. The cervix is bulbous.Pap with HPV and GC/CHL performed.   Uterus is felt to be normal size, shape, and contour.  No adnexal masses or tenderness noted.Bladder is non tender, no masses felt. Rectal: Good sphincter tone, no polyps, or hemorrhoids felt.  Hemoccult negative. Extremities/musculoskeletal:  No swelling or varicosities noted, no clubbing or cyanosis Psych:  No mood changes, alert and cooperative,seems happy Fall risk is low . PHQ 9 score is 5, denies being suicidal and is on meds.  Examination chaperoned by Levy Pupa  LPN. She requests HIV testing. Discussed menopausal symptoms and HRT and she declines.   Impression: 1. Encounter for gynecological examination with Papanicolaou smear of cervix   2. Screening for colorectal cancer   3. Menopause present, declines hormone replacement therapy   4. Screening examination for STD (sexually transmitted disease)       Plan: Check HIV and RPR Pap with HPV and GC/CHL sent Review handout on menopause Mammogram today Has appt with Dr Gala Romney to get colonoscopy scheduled Labs with PCP Physical in 1 year Pap in 3 if normal

## 2018-07-12 LAB — RPR: RPR Ser Ql: NONREACTIVE

## 2018-07-12 LAB — HIV ANTIBODY (ROUTINE TESTING W REFLEX): HIV Screen 4th Generation wRfx: NONREACTIVE

## 2018-07-15 LAB — CYTOLOGY - PAP
Adequacy: ABSENT
CHLAMYDIA, DNA PROBE: NEGATIVE
Diagnosis: NEGATIVE
HPV: NOT DETECTED
Neisseria Gonorrhea: NEGATIVE

## 2018-07-17 ENCOUNTER — Ambulatory Visit (HOSPITAL_COMMUNITY): Payer: BLUE CROSS/BLUE SHIELD

## 2018-07-25 ENCOUNTER — Ambulatory Visit: Payer: BLUE CROSS/BLUE SHIELD | Admitting: Internal Medicine

## 2018-08-26 ENCOUNTER — Other Ambulatory Visit: Payer: Self-pay | Admitting: Family Medicine

## 2018-09-16 ENCOUNTER — Other Ambulatory Visit: Payer: Self-pay

## 2018-09-16 ENCOUNTER — Ambulatory Visit (INDEPENDENT_AMBULATORY_CARE_PROVIDER_SITE_OTHER): Payer: BLUE CROSS/BLUE SHIELD | Admitting: Internal Medicine

## 2018-09-16 ENCOUNTER — Telehealth: Payer: Self-pay | Admitting: *Deleted

## 2018-09-16 ENCOUNTER — Other Ambulatory Visit: Payer: Self-pay | Admitting: *Deleted

## 2018-09-16 ENCOUNTER — Encounter: Payer: Self-pay | Admitting: Internal Medicine

## 2018-09-16 DIAGNOSIS — Z86010 Personal history of colonic polyps: Secondary | ICD-10-CM

## 2018-09-16 MED ORDER — PEG 3350-KCL-NA BICARB-NACL 420 G PO SOLR: 4000.0000 mL | Freq: Once | ORAL | 0 refills | Status: AC

## 2018-09-16 NOTE — Progress Notes (Addendum)
Referring Provider:  Primary Care Physician:  Mikey Kirschner, MD  Primary GI:   Virtual Visit via Telephone Note Due to COVID-19, visit is conducted virtually and was requested by patient.   I connected with Shannon Ashley on 09/16/18 at  8:00 AM EDT by telephone and verified that I am speaking with the correct person using two identifiers.   I discussed the limitations, risks, security and privacy concerns of performing an evaluation and management service by telephone and the availability of in person appointments. I also discussed with the patient that there may be a patient responsible charge related to this service. The patient expressed understanding and agreed to proceed.  Chief Complaint  Patient presents with  . Colonoscopy    past due, doing ok     History of Present Illness:   Pleasant 53 year old lady with a history of colonic polyps.  1 cm polyp among others removed 2016.  Adenomas.  Overdue for a surveillance examination at this time.  No major interim health issues aside from challenges of managing hypertension.  Started using Xanax about 2 years ago-takes 2-3 times weekly as needed. Past Medical History:  Diagnosis Date  . Anxiety   . Hyperlipidemia   . Hypertension      Past Surgical History:  Procedure Laterality Date  . COLONOSCOPY N/A 10/13/2014   Procedure: COLONOSCOPY;  Surgeon: Daneil Dolin, MD;  Location: [facility];  Service: Endoscopy;  Laterality: N/A;  830   . ECTOPIC PREGNANCY SURGERY    . ENDOMETRIAL ABLATION    . TUBAL LIGATION       Current Meds  Medication Sig  . ALPRAZolam (XANAX) 0.5 MG tablet Take 1 tablet (0.5 mg total) by mouth 2 (two) times daily as needed. for anxiety  . BIOTIN PO Take by mouth. 3 times a week  . cetirizine (ZYRTEC) 10 MG tablet Take 10 mg by mouth as needed for allergies.  Marland Kitchen escitalopram (LEXAPRO) 20 MG tablet TAKE ONE (1) TABLET BY MOUTH EVERY DAY  . fluticasone (FLONASE) 50 MCG/ACT nasal spray USE TWO  SPRAYS IN BOTH NOSTRILS DAILY  . indapamide (LOZOL) 2.5 MG tablet Take 1 tablet (2.5 mg total) by mouth daily.  Marland Kitchen losartan (COZAAR) 100 MG tablet Take 1 tablet (100 mg total) by mouth daily.  . meloxicam (MOBIC) 15 MG tablet Take 1 tablet (15 mg total) by mouth daily. Prn ankle pain (Patient taking differently: Take 15 mg by mouth as needed. Prn ankle pain)  . Multiple Vitamin (MULTIVITAMIN) tablet Take 1 tablet by mouth daily.  . rosuvastatin (CRESTOR) 5 MG tablet Take 1 tablet (5 mg total) by mouth daily. For cholesterol  . traZODone (DESYREL) 50 MG tablet TAKE ONE TABLET BY MOUTH EVERY NIGHT AT BEDTIME FOR SLEEP (Patient taking differently: Take 50 mg by mouth at bedtime as needed. TAKE ONE TABLET BY MOUTH EVERY NIGHT AT BEDTIME FOR SLEEP)       Observations/Objective: No distress. Unable to perform physical exam due to telephone encounter. No video available.   Assessment and Plan: Pleasant 53 year old lady with a history of multiple colonic adenomas removed 2016.  Overdue for surveillance at this time.  Feel best practice is to go ahead and offer her a surveillance colonoscopy at this time.  The risks, benefits, limitations, alternatives and imponderables have been reviewed with the patient.  She does take as needed Xanax which she started since her last colonoscopy.  I continue to feel that she will do very well  with conscious sedation candidate.  Questions have been answered. All parties are agreeable.   Follow Up Instructions:    I discussed the assessment and treatment plan with the patient. The patient was provided an opportunity to ask questions and all were answered. The patient agreed with the plan and demonstrated an understanding of the instructions.   The patient was advised to call back or seek an in-person evaluation if the symptoms worsen or if the condition fails to improve as anticipated.  I provided  9 minutes of non-face-to-face time during this encounter.  R Garfield Cornea,  MD [facility]

## 2018-09-16 NOTE — Patient Instructions (Signed)
We will schedule a surveillance colonoscopy-history of polyps-conscious sedation  Further recommendations to follow after colonoscopy has been completed.

## 2018-09-16 NOTE — Telephone Encounter (Signed)
Called patient and TCS is scheduled for 7/7 at 8:30am. Patient aware will send prep to the pharmacy. Instructions mailed (confirmed address). Orders entered.

## 2018-10-16 ENCOUNTER — Other Ambulatory Visit: Payer: Self-pay | Admitting: Family Medicine

## 2018-10-16 ENCOUNTER — Telehealth: Payer: Self-pay | Admitting: *Deleted

## 2018-10-16 MED ORDER — ALPRAZOLAM 0.5 MG PO TABS: 0.5000 mg | ORAL_TABLET | Freq: Two times a day (BID) | ORAL | 0 refills | Status: DC | PRN

## 2018-10-16 NOTE — Telephone Encounter (Signed)
Andy from [facility] called to check on status of refill request for alprazolam. I did not see previous request in system. Pt last seen 04/23/18

## 2018-10-16 NOTE — Telephone Encounter (Signed)
I sent 1 prescription for this today The patient will need to do a follow-up office visit with Dr. Richardson Landry before further either virtual or in person

## 2018-10-16 NOTE — Telephone Encounter (Signed)
Has appt 10/27/18

## 2018-10-27 ENCOUNTER — Ambulatory Visit (INDEPENDENT_AMBULATORY_CARE_PROVIDER_SITE_OTHER): Payer: BLUE CROSS/BLUE SHIELD | Admitting: Family Medicine

## 2018-10-27 ENCOUNTER — Encounter: Payer: Self-pay | Admitting: Family Medicine

## 2018-10-27 ENCOUNTER — Other Ambulatory Visit: Payer: Self-pay

## 2018-10-27 DIAGNOSIS — I10 Essential (primary) hypertension: Secondary | ICD-10-CM | POA: Diagnosis not present

## 2018-10-27 DIAGNOSIS — F320 Major depressive disorder, single episode, mild: Secondary | ICD-10-CM | POA: Diagnosis not present

## 2018-10-27 DIAGNOSIS — F419 Anxiety disorder, unspecified: Secondary | ICD-10-CM | POA: Diagnosis not present

## 2018-10-27 DIAGNOSIS — E785 Hyperlipidemia, unspecified: Secondary | ICD-10-CM

## 2018-10-27 MED ORDER — LOSARTAN POTASSIUM 100 MG PO TABS: 100.0000 mg | ORAL_TABLET | Freq: Every day | ORAL | 5 refills | Status: DC

## 2018-10-27 MED ORDER — TRAZODONE HCL 50 MG PO TABS: ORAL_TABLET | ORAL | 5 refills | Status: DC

## 2018-10-27 MED ORDER — INDAPAMIDE 2.5 MG PO TABS: 2.5000 mg | ORAL_TABLET | Freq: Every day | ORAL | 5 refills | Status: DC

## 2018-10-27 MED ORDER — ALPRAZOLAM 0.5 MG PO TABS: 0.5000 mg | ORAL_TABLET | Freq: Two times a day (BID) | ORAL | 5 refills | Status: DC | PRN

## 2018-10-27 MED ORDER — ESCITALOPRAM OXALATE 20 MG PO TABS: ORAL_TABLET | ORAL | 5 refills | Status: DC

## 2018-10-27 MED ORDER — ROSUVASTATIN CALCIUM 5 MG PO TABS: 5.0000 mg | ORAL_TABLET | Freq: Every day | ORAL | 5 refills | Status: DC

## 2018-10-27 NOTE — Progress Notes (Signed)
   Subjective:    Patient ID: Shannon Ashley, female    DOB: 1966-01-28, 53 y.o.   MRN: [id number]  Hypertension  This is a chronic problem. The current episode started more than 1 year ago. Risk factors for coronary artery disease include dyslipidemia and post-menopausal state. Treatments tried: losartan and lozol.   Virtual Visit via Video Note  I connected with Shannon Ashley on 10/27/18 at  9:00 AM EDT by a video enabled telemedicine application and verified that I am speaking with the correct person using two identifiers.  Location: Patient: home Provider: office   I discussed the limitations of evaluation and management by telemedicine and the availability of in person appointments. The patient expressed understanding and agreed to proceed.  History of Present Illness:    Observations/Objective:   Assessment and Plan:   Follow Up Instructions:    I discussed the assessment and treatment plan with the patient. The patient was provided an opportunity to ask questions and all were answered. The patient agreed with the plan and demonstrated an understanding of the instructions.   The patient was advised to call back or seek an in-person evaluation if the symptoms worsen or if the condition fails to improve as anticipated.  I provided 25 minutes of non-face-to-face time during this encounter.  Blood pressure medicine and blood pressure levels reviewed today with patient. Compliant with blood pressure medicine. States does not miss a dose. No obvious side effects. Blood pressure generally good when checked elsewhere. Watching salt intake.   Patient continues to take lipid medication regularly. No obvious side effects from it. Generally does not miss a dose. Prior blood work results are reviewed with patient. Patient continues to work on fat intake in diet  Patient notes ongoing compliance with antidepressant medication. No obvious side effects. Reports does not miss a dose.  Overall continues to help depression substantially. No thoughts of homicide or suicide. Would like to maintain medication. Also development of chronic anxiety.  Averaging 45 Xanax's per month.  Overall this helps her situation  Exercise is been spotty they are working on it at this time.  Diet 2 has not been the best but also working on it  Blood pressure great when checked systolic in the 852D diastolic under 80     Review of Systems No headache, no major weight loss or weight gain, no chest pain no back pain abdominal pain no change in bowel habits complete ROS otherwise negative     Objective:   Physical Exam Virtual visit       Assessment & Plan:  Impression 1 hypertension.  Good control discussed compliance discussed diet and exercise discussed maintain same dose meds  2.  Hyperlipidemia.  Importance of compliance discussed.  Diet discussed.  Hold off on blood work at this time rationale discussed  3.  Chronic depression with element of anxiety.  Ongoing.  Definite improvement from meds.  Importance of exercise as adjunct therapy discussed.  Maintain same clarified Xanax usage 45/month okay with 6 months worth of refills  Greater than 50% of this 25 minute face to face visit was spent in counseling and discussion and coordination of care regarding the above diagnosis/diagnosies

## 2018-11-20 ENCOUNTER — Other Ambulatory Visit: Payer: Self-pay | Admitting: Family Medicine

## 2018-11-20 ENCOUNTER — Telehealth: Payer: Self-pay | Admitting: *Deleted

## 2018-11-20 NOTE — Telephone Encounter (Signed)
Tried to call pt to schedule COVID 19 screening but vm was not set up so unable to leave a message.

## 2018-11-20 NOTE — Telephone Encounter (Signed)
Frustrating This with 5 refills was given back on June 1 Lets send in again

## 2018-11-24 ENCOUNTER — Telehealth: Payer: Self-pay | Admitting: *Deleted

## 2018-11-24 NOTE — Telephone Encounter (Signed)
Pt is scheduled for her COVID 19 screening on 11/27/2018.  Pt is aware to remain in quarantine once testing is done.  Pt voiced understanding.

## 2018-11-27 ENCOUNTER — Other Ambulatory Visit (HOSPITAL_COMMUNITY)
Admission: RE | Admit: 2018-11-27 | Discharge: 2018-11-27 | Disposition: A | Discharge status: Home / Self Care | Payer: BC Managed Care – PPO | Source: Ambulatory Visit | Attending: Internal Medicine | Admitting: Internal Medicine

## 2018-11-27 DIAGNOSIS — Z1159 Encounter for screening for other viral diseases: Secondary | ICD-10-CM | POA: Diagnosis not present

## 2018-11-27 DIAGNOSIS — Z01812 Encounter for preprocedural laboratory examination: Secondary | ICD-10-CM | POA: Diagnosis not present

## 2018-11-28 LAB — SARS CORONAVIRUS 2 (TAT 6-24 HRS): SARS Coronavirus 2: NEGATIVE

## 2018-12-02 ENCOUNTER — Ambulatory Visit (HOSPITAL_COMMUNITY)
Admission: RE | Admit: 2018-12-02 | Discharge: 2018-12-02 | Disposition: A | Discharge status: Home / Self Care | Payer: BC Managed Care – PPO | Attending: Internal Medicine | Admitting: Internal Medicine

## 2018-12-02 ENCOUNTER — Encounter (HOSPITAL_COMMUNITY): Payer: Self-pay | Admitting: *Deleted

## 2018-12-02 ENCOUNTER — Encounter (HOSPITAL_COMMUNITY)
Admission: RE | Disposition: A | Discharge status: Home / Self Care | Payer: Self-pay | Source: Home / Self Care | Attending: Internal Medicine

## 2018-12-02 ENCOUNTER — Other Ambulatory Visit: Payer: Self-pay

## 2018-12-02 DIAGNOSIS — F419 Anxiety disorder, unspecified: Secondary | ICD-10-CM | POA: Diagnosis not present

## 2018-12-02 DIAGNOSIS — I10 Essential (primary) hypertension: Secondary | ICD-10-CM | POA: Insufficient documentation

## 2018-12-02 DIAGNOSIS — K635 Polyp of colon: Secondary | ICD-10-CM | POA: Diagnosis not present

## 2018-12-02 DIAGNOSIS — Z888 Allergy status to other drugs, medicaments and biological substances status: Secondary | ICD-10-CM | POA: Diagnosis not present

## 2018-12-02 DIAGNOSIS — Z1211 Encounter for screening for malignant neoplasm of colon: Secondary | ICD-10-CM | POA: Diagnosis not present

## 2018-12-02 DIAGNOSIS — E785 Hyperlipidemia, unspecified: Secondary | ICD-10-CM | POA: Insufficient documentation

## 2018-12-02 DIAGNOSIS — D122 Benign neoplasm of ascending colon: Secondary | ICD-10-CM | POA: Diagnosis not present

## 2018-12-02 DIAGNOSIS — Z79899 Other long term (current) drug therapy: Secondary | ICD-10-CM | POA: Diagnosis not present

## 2018-12-02 DIAGNOSIS — Z86010 Personal history of colonic polyps: Secondary | ICD-10-CM | POA: Insufficient documentation

## 2018-12-02 HISTORY — PX: POLYPECTOMY: SHX5525

## 2018-12-02 HISTORY — PX: COLONOSCOPY: SHX5424

## 2018-12-02 SURGERY — COLONOSCOPY
Anesthesia: Moderate Sedation

## 2018-12-02 MED ORDER — MEPERIDINE HCL 50 MG/ML IJ SOLN
INTRAMUSCULAR | Status: AC
  Filled 2018-12-02: qty 1

## 2018-12-02 MED ORDER — MIDAZOLAM HCL 5 MG/5ML IJ SOLN
INTRAMUSCULAR | Status: DC | PRN
  Administered 2018-12-02: 2 mg via INTRAVENOUS
  Administered 2018-12-02: 1 mg via INTRAVENOUS
  Administered 2018-12-02: 2 mg via INTRAVENOUS
  Administered 2018-12-02: 1 mg via INTRAVENOUS
  Administered 2018-12-02: 2 mg via INTRAVENOUS

## 2018-12-02 MED ORDER — SODIUM CHLORIDE 0.9 % IV SOLN
INTRAVENOUS | Status: DC
  Administered 2018-12-02: 08:00:00 via INTRAVENOUS

## 2018-12-02 MED ORDER — MEPERIDINE HCL 100 MG/ML IJ SOLN
INTRAMUSCULAR | Status: DC | PRN
  Administered 2018-12-02: 25 mg via INTRAVENOUS
  Administered 2018-12-02: 10 mg via INTRAVENOUS
  Administered 2018-12-02: 15 mg via INTRAVENOUS

## 2018-12-02 MED ORDER — ONDANSETRON HCL 4 MG/2ML IJ SOLN
INTRAMUSCULAR | Status: DC | PRN
  Administered 2018-12-02: 4 mg via INTRAVENOUS

## 2018-12-02 MED ORDER — STERILE WATER FOR IRRIGATION IR SOLN
Status: DC | PRN
  Administered 2018-12-02: 2.5 mL

## 2018-12-02 MED ORDER — ONDANSETRON HCL 4 MG/2ML IJ SOLN
INTRAMUSCULAR | Status: AC
  Filled 2018-12-02: qty 2

## 2018-12-02 MED ORDER — MIDAZOLAM HCL 5 MG/5ML IJ SOLN
INTRAMUSCULAR | Status: AC
  Filled 2018-12-02: qty 10

## 2018-12-02 NOTE — Discharge Instructions (Signed)
Colonoscopy Discharge Instructions  Read the instructions outlined below and refer to this sheet in the next few weeks. These discharge instructions provide you with general information on caring for yourself after you leave the hospital. Your doctor may also give you specific instructions. While your treatment has been planned according to the most current medical practices available, unavoidable complications occasionally occur. If you have any problems or questions after discharge, call Dr. Gala Romney at [phone]. ACTIVITY  You may resume your regular activity, but move at a slower pace for the next 24 hours.   Take frequent rest periods for the next 24 hours.   Walking will help get rid of the air and reduce the bloated feeling in your belly (abdomen).   No driving for 24 hours (because of the medicine (anesthesia) used during the test).    Do not sign any important legal documents or operate any machinery for 24 hours (because of the anesthesia used during the test).  NUTRITION  Drink plenty of fluids.   You may resume your normal diet as instructed by your doctor.   Begin with a light meal and progress to your normal diet. Heavy or fried foods are harder to digest and may make you feel sick to your stomach (nauseated).   Avoid alcoholic beverages for 24 hours or as instructed.  MEDICATIONS  You may resume your normal medications unless your doctor tells you otherwise.  WHAT YOU CAN EXPECT TODAY  Some feelings of bloating in the abdomen.   Passage of more gas than usual.   Spotting of blood in your stool or on the toilet paper.  IF YOU HAD POLYPS REMOVED DURING THE COLONOSCOPY:  No aspirin products for 7 days or as instructed.   No alcohol for 7 days or as instructed.   Eat a soft diet for the next 24 hours.  FINDING OUT THE RESULTS OF YOUR TEST Not all test results are available during your visit. If your test results are not back during the visit, make an appointment  with your caregiver to find out the results. Do not assume everything is normal if you have not heard from your caregiver or the medical facility. It is important for you to follow up on all of your test results.  SEEK IMMEDIATE MEDICAL ATTENTION IF:  You have more than a spotting of blood in your stool.   Your belly is swollen (abdominal distention).   You are nauseated or vomiting.   You have a temperature over 101.   You have abdominal pain or discomfort that is severe or gets worse throughout the day.    Polyp information provided  Further recommendations to follow pending review of pathology report  I called the patient's daughter, [place], at [phone].  No answer.   Colon Polyps  Polyps are tissue growths inside the body. Polyps can grow in many places, including the large intestine (colon). A polyp may be a round bump or a mushroom-shaped growth. You could have one polyp or several. Most colon polyps are noncancerous (benign). However, some colon polyps can become cancerous over time. Finding and removing the polyps early can help prevent this. What are the causes? The exact cause of colon polyps is not known. What increases the risk? You are more likely to develop this condition if you:  Have a family history of colon cancer or colon polyps.  Are older than 5 or older than 45 if you are African American.  Have inflammatory bowel disease, such as  ulcerative colitis or Crohn's disease.  Have certain hereditary conditions, such as: ? Familial adenomatous polyposis. ? Lynch syndrome. ? Turcot syndrome. ? Peutz-Jeghers syndrome.  Are overweight.  Smoke cigarettes.  Do not get enough exercise.  Drink too much alcohol.  Eat a diet that is high in fat and red meat and low in fiber.  Had childhood cancer that was treated with abdominal radiation. What are the signs or symptoms? Most polyps do not cause symptoms. If you have symptoms, they may include:  Blood  coming from your rectum when having a bowel movement.  Blood in your stool. The stool may look dark red or black.  Abdominal pain.  A change in bowel habits, such as constipation or diarrhea. How is this diagnosed? This condition is diagnosed with a colonoscopy. This is a procedure in which a lighted, flexible scope is inserted into the anus and then passed into the colon to examine the area. Polyps are sometimes found when a colonoscopy is done as part of routine cancer screening tests. How is this treated? Treatment for this condition involves removing any polyps that are found. Most polyps can be removed during a colonoscopy. Those polyps will then be tested for cancer. Additional treatment may be needed depending on the results of testing. Follow these instructions at home: Lifestyle  Maintain a healthy weight, or lose weight if recommended by your health care provider.  Exercise every day or as told by your health care provider.  Do not use any products that contain nicotine or tobacco, such as cigarettes and e-cigarettes. If you need help quitting, ask your health care provider.  If you drink alcohol, limit how much you have: ? 0-1 drink a day for women. ? 0-2 drinks a day for men.  Be aware of how much alcohol is in your drink. In the [place] one drink equals one 12 oz bottle of beer (355 mL), one 5 oz glass of wine (148 mL), or one 1 oz shot of hard liquor (44 mL). Eating and drinking   Eat foods that are high in fiber, such as fruits, vegetables, and whole grains.  Eat foods that are high in calcium and vitamin D, such as milk, cheese, yogurt, eggs, liver, fish, and broccoli.  Limit foods that are high in fat, such as fried foods and desserts.  Limit the amount of red meat and processed meat you eat, such as hot dogs, sausage, bacon, and lunch meats. General instructions  Keep all follow-up visits as told by your health care provider. This is important. ? This includes  having regularly scheduled colonoscopies. ? Talk to your health care provider about when you need a colonoscopy. Contact a health care provider if:  You have new or worsening bleeding during a bowel movement.  You have new or increased blood in your stool.  You have a change in bowel habits.  You lose weight for no known reason. Summary  Polyps are tissue growths inside the body. Polyps can grow in many places, including the colon.  Most colon polyps are noncancerous (benign), but some can become cancerous over time.  This condition is diagnosed with a colonoscopy.  Treatment for this condition involves removing any polyps that are found. Most polyps can be removed during a colonoscopy. This information is not intended to replace advice given to you by your health care provider. Make sure you discuss any questions you have with your health care provider. Document Released: 02/08/2004 Document Revised: 08/29/2017 Document Reviewed: 08/29/2017  Elsevier Patient Education  [employer].

## 2018-12-02 NOTE — Op Note (Signed)
[facility] Patient Name: Shannon Ashley Procedure Date: 12/02/2018 8:04 AM MRN: [id number] Date of Birth: May 03, 1966 Attending MD: Norvel Richards , MD CSN: [id number] Age: 53 Admit Type: Outpatient Procedure:                Colonoscopy Indications:              High risk colon cancer surveillance: Personal                            history of colonic polyps Providers:                Norvel Richards, MD, Otis Peak B. Gwenlyn Perking RN, RN,                            Aram Candela Referring MD:              Medicines:                Midazolam 8 mg IV, Meperidine 50 mg IV Complications:            No immediate complications. Estimated Blood Loss:     Estimated blood loss was minimal. Procedure:                Pre-Anesthesia Assessment:                           - Prior to the procedure, a History and Physical                            was performed, and patient medications and                            allergies were reviewed. The patient's tolerance of                            previous anesthesia was also reviewed. The risks                            and benefits of the procedure and the sedation                            options and risks were discussed with the patient.                            All questions were answered, and informed consent                            was obtained. Prior Anticoagulants: The patient has                            taken no previous anticoagulant or antiplatelet                            agents. ASA Grade Assessment: II - A patient with  mild systemic disease. After reviewing the risks                            and benefits, the patient was deemed in                            satisfactory condition to undergo the procedure.                           After obtaining informed consent, the colonoscope                            was passed under direct vision. Throughout the                            procedure,  the patient's blood pressure, pulse, and                            oxygen saturations were monitored continuously. The                            [id number]-[id number] ([id number]) scope was introduced through                            the anus and advanced to the the cecum, identified                            by appendiceal orifice and ileocecal valve. The                            colonoscopy was performed without difficulty. The                            patient tolerated the procedure well. The quality                            of the bowel preparation was adequate. Scope In: 8:23:48 AM Scope Out: 8:43:30 AM Scope Withdrawal Time: 0 hours 10 minutes 2 seconds  Total Procedure Duration: 0 hours 19 minutes 42 seconds  Findings:      The perianal and digital rectal examinations were normal.      Three semi-pedunculated polyps were found in the ascending colon. The       polyps were 3 to 5 mm in size. These polyps were removed with a cold       snare. Resection complete; 2 of 3 recovered.. Estimated blood loss was       minimal.      The exam was otherwise without abnormality on direct and retroflexion       views. Impression:               - Three 3 to 5 mm polyps in the ascending colon,                            removed with a cold snare. Marland Kitchen                           -  The examination was otherwise normal on direct                            and retroflexion views. Moderate Sedation:      Moderate (conscious) sedation was administered by the endoscopy nurse       and supervised by the endoscopist. The following parameters were       monitored: oxygen saturation, heart rate, blood pressure, respiratory       rate, EKG, adequacy of pulmonary ventilation, and response to care. Recommendation:           - Patient has a contact number available for                            emergencies. The signs and symptoms of potential                            delayed complications were discussed with the                             patient. Return to normal activities tomorrow.                            Written discharge instructions were provided to the                            patient.                           - Resume previous diet.                           - Continue present medications.                           - Repeat colonoscopy date to be determined after                            pending pathology results are reviewed for                            surveillance.                           - Return to GI office (date not yet determined). Procedure Code(s):        --- Professional ---                           [phone], Colonoscopy, flexible; with removal of                            tumor(s), polyp(s), or other lesion(s) by snare                            technique Diagnosis Code(s):        --- Professional ---  Z86.010, Personal history of colonic polyps                           K63.5, Polyp of colon CPT copyright 2019 American Medical Association. All rights reserved. The codes documented in this report are preliminary and upon coder review may  be revised to meet current compliance requirements. Cristopher Estimable. Shantasia Hunnell, MD Norvel Richards, MD 12/02/2018 8:50:49 AM This report has been signed electronically. Number of Addenda: 0

## 2018-12-02 NOTE — H&P (Signed)
@LOGO @   Primary Care Physician:  Mikey Kirschner, MD Primary Gastroenterologist:  Dr. Gala Romney  Pre-Procedure History & Physical: HPI:  Shannon Ashley is a 53 y.o. female here for surveillance colonoscopy.  History of multiple colonic adenomas removed 2016.   Past Medical History:  Diagnosis Date  . Anxiety   . Hyperlipidemia   . Hypertension     Past Surgical History:  Procedure Laterality Date  . COLONOSCOPY N/A 10/13/2014   Procedure: COLONOSCOPY;  Surgeon: Daneil Dolin, MD;  Location: [facility];  Service: Endoscopy;  Laterality: N/A;  830   . ECTOPIC PREGNANCY SURGERY    . ENDOMETRIAL ABLATION    . TUBAL LIGATION      Prior to Admission medications   Medication Sig Start Date End Date Taking? Authorizing Provider  ALPRAZolam Duanne Moron) 0.5 MG tablet Take 1 tablet (0.5 mg total) by mouth 2 (two) times daily as needed. for anxiety Patient taking differently: Take 0.5 mg by mouth 2 (two) times daily as needed for anxiety.  10/27/18  Yes Mikey Kirschner, MD  BIOTIN PO Take 1 tablet by mouth daily.    Yes [provider]  cetirizine (ZYRTEC) 10 MG tablet Take 10 mg by mouth daily as needed for allergies.    Yes [provider]  Cyanocobalamin (VITAMIN B-12 PO) Take 1 each by mouth once a week.   Yes [provider]  escitalopram (LEXAPRO) 20 MG tablet TAKE ONE (1) TABLET BY MOUTH EVERY DAY Patient taking differently: Take 20 mg by mouth daily.  10/27/18  Yes Mikey Kirschner, MD  fluticasone (FLONASE) 50 MCG/ACT nasal spray USE TWO SPRAYS IN BOTH NOSTRILS DAILY Patient taking differently: Place 2 sprays into both nostrils daily.  11/20/18  Yes Mikey Kirschner, MD  indapamide (LOZOL) 2.5 MG tablet Take 1 tablet (2.5 mg total) by mouth daily. 10/27/18  Yes Mikey Kirschner, MD  losartan (COZAAR) 100 MG tablet Take 1 tablet (100 mg total) by mouth daily. 10/27/18  Yes Mikey Kirschner, MD  meloxicam (MOBIC) 15 MG tablet Take 1 tablet (15 mg total) by  mouth daily. Prn ankle pain Patient taking differently: Take 15 mg by mouth daily as needed for pain.  12/06/16  Yes Nilda Simmer, NP  Multiple Vitamin (MULTIVITAMIN) tablet Take 1 tablet by mouth daily.   Yes [provider]  rosuvastatin (CRESTOR) 5 MG tablet Take 1 tablet (5 mg total) by mouth daily. For cholesterol 10/27/18  Yes Mikey Kirschner, MD  traZODone (DESYREL) 50 MG tablet TAKE ONE TABLET BY MOUTH EVERY NIGHT AT BEDTIME FOR SLEEP Patient taking differently: Take 50 mg by mouth at bedtime as needed for sleep.  10/27/18  Yes Mikey Kirschner, MD    Allergies as of 09/16/2018 - Review Complete 09/16/2018  Allergen Reaction Noted  . Lisinopril Cough 02/04/2016    Family History  Problem Relation Age of Onset  . Cancer Mother        lung  . Hypertension Mother   . Stroke Father   . Heart attack Father   . Congestive Heart Failure Father   . Kidney failure Father   . Colon polyps Father   . Hypertension Sister   . Drug abuse Sister   . Diabetes Maternal Uncle   . Early death Paternal 71   . Asthma Paternal Aunt   . Cancer Maternal Grandmother        lung  . Congestive Heart Failure Paternal Grandmother   .  Cancer Paternal Grandmother        pancreatic    Social History   Socioeconomic History  . Marital status: Legally Separated    Spouse name: Not on file  . Number of children: Not on file  . Years of education: Not on file  . Highest education level: Not on file  Occupational History  . Not on file  Social Needs  . Financial resource strain: Not on file  . Food insecurity    Worry: Not on file    Inability: Not on file  . Transportation needs    Medical: Not on file    Non-medical: Not on file  Tobacco Use  . Smoking status: Never Smoker  . Smokeless tobacco: Never Used  Substance and Sexual Activity  . Alcohol use: Yes    Alcohol/week: 4.0 standard drinks    Types: 4 Glasses of wine per week    Comment: occ wine  . Drug use: No  .  Sexual activity: Yes    Birth control/protection: Surgical    Comment: tubal and ablation  Lifestyle  . Physical activity    Days per week: Not on file    Minutes per session: Not on file  . Stress: Not on file  Relationships  . Social Herbalist on phone: Not on file    Gets together: Not on file    Attends religious service: Not on file    Active member of club or organization: Not on file    Attends meetings of clubs or organizations: Not on file    Relationship status: Not on file  . Intimate partner violence    Fear of current or ex partner: Not on file    Emotionally abused: Not on file    Physically abused: Not on file    Forced sexual activity: Not on file  Other Topics Concern  . Not on file  Social History Narrative  . Not on file    Review of Systems: See HPI, otherwise negative ROS  Physical Exam: BP 112/73   Pulse 66   Temp 98.8 F (37.1 C) (Oral)   Resp 16   SpO2 98%  General:   Alert,  Well-developed, well-nourished, pleasant and cooperative in NAD Skin:  Intact without significant lesions or rashes. Neck:  Supple; no masses or thyromegaly. No significant cervical adenopathy. Lungs:  Clear throughout to auscultation.   No wheezes, crackles, or rhonchi. No acute distress. Heart:  Regular rate and rhythm; no murmurs, clicks, rubs,  or gallops. Abdomen: Non-distended, normal bowel sounds.  Soft and nontender without appreciable mass or hepatosplenomegaly.  Pulses:  Normal pulses noted. Extremities:  Without clubbing or edema.  Impression/Plan: Pleasant 53 year old lady with a history of multiple colonic adenomas; here for surveillance colonoscopy per plan.   The risks, benefits, limitations, alternatives and imponderables have been reviewed with the patient. Questions have been answered. All parties are agreeable.      Notice: This dictation was prepared with Dragon dictation along with smaller phrase technology. Any transcriptional errors that  result from this process are unintentional and may not be corrected upon review.

## 2018-12-03 ENCOUNTER — Encounter: Payer: Self-pay | Admitting: Internal Medicine

## 2018-12-05 ENCOUNTER — Encounter (HOSPITAL_COMMUNITY): Payer: Self-pay | Admitting: Internal Medicine

## 2019-02-11 ENCOUNTER — Other Ambulatory Visit: Payer: Self-pay | Admitting: Family Medicine

## 2019-06-02 ENCOUNTER — Other Ambulatory Visit: Payer: Self-pay | Admitting: Family Medicine

## 2019-06-02 NOTE — Telephone Encounter (Signed)
Last med check was 10/27/18

## 2019-06-05 NOTE — Telephone Encounter (Signed)
Six mo worth 

## 2019-07-01 ENCOUNTER — Other Ambulatory Visit: Payer: Self-pay | Admitting: Family Medicine

## 2019-07-01 ENCOUNTER — Encounter: Payer: Self-pay | Admitting: Family Medicine

## 2019-07-02 NOTE — Telephone Encounter (Signed)
Scheduled 3/2

## 2019-07-02 NOTE — Telephone Encounter (Signed)
Please contact patient to set up appt, then may route back to nurses. Thank you

## 2019-07-28 ENCOUNTER — Ambulatory Visit: Payer: BLUE CROSS/BLUE SHIELD | Admitting: Family Medicine

## 2019-08-20 ENCOUNTER — Ambulatory Visit: Payer: BC Managed Care – PPO | Attending: Internal Medicine

## 2019-08-20 DIAGNOSIS — Z23 Encounter for immunization: Secondary | ICD-10-CM

## 2019-08-20 NOTE — Progress Notes (Signed)
COVID-19 Vaccine Information can be found at: [email or web address] For questions related to vaccine distribution or appointments, please email [email or web address] or call [phone].    

## 2019-09-17 ENCOUNTER — Ambulatory Visit: Payer: BC Managed Care – PPO | Attending: Internal Medicine

## 2019-09-17 DIAGNOSIS — Z23 Encounter for immunization: Secondary | ICD-10-CM

## 2019-09-17 NOTE — Progress Notes (Signed)
   Covid-19 Vaccination Clinic  Name:  Shannon Ashley    MRN: [id number] DOB: 11-Mar-1966  09/17/2019  Shannon Ashley was observed post Covid-19 immunization for 15 minutes without incident. She was provided with Vaccine Information Sheet and instruction to access the V-Safe system.   Shannon Ashley was instructed to call 911 with any severe reactions post vaccine: Marland Kitchen Difficulty breathing  . Swelling of face and throat  . A fast heartbeat  . A bad rash all over body  . Dizziness and weakness   Immunizations Administered    Name Date Dose VIS Date Route   Moderna COVID-19 Vaccine 09/17/2019 10:25 AM 0.5 mL 04/2019 Intramuscular   Manufacturer: Moderna   Lot: [id number]   [id number]

## 2019-11-04 ENCOUNTER — Other Ambulatory Visit: Payer: Self-pay | Admitting: Family Medicine

## 2019-11-04 NOTE — Telephone Encounter (Signed)
Patient made appointment for 6/18

## 2019-11-04 NOTE — Telephone Encounter (Signed)
Tried to call pt, vm box not set up

## 2019-11-13 ENCOUNTER — Other Ambulatory Visit: Payer: Self-pay

## 2019-11-13 ENCOUNTER — Encounter: Payer: Self-pay | Admitting: Family Medicine

## 2019-11-13 ENCOUNTER — Other Ambulatory Visit: Payer: Self-pay | Admitting: Nurse Practitioner

## 2019-11-13 ENCOUNTER — Ambulatory Visit: Payer: BC Managed Care – PPO | Admitting: Family Medicine

## 2019-11-13 VITALS — BP 118/74 | HR 92 | Temp 97.7°F | Wt 199.4 lb

## 2019-11-13 DIAGNOSIS — E785 Hyperlipidemia, unspecified: Secondary | ICD-10-CM

## 2019-11-13 DIAGNOSIS — I10 Essential (primary) hypertension: Secondary | ICD-10-CM | POA: Diagnosis not present

## 2019-11-13 DIAGNOSIS — Z79899 Other long term (current) drug therapy: Secondary | ICD-10-CM | POA: Diagnosis not present

## 2019-11-13 DIAGNOSIS — F419 Anxiety disorder, unspecified: Secondary | ICD-10-CM

## 2019-11-13 MED ORDER — ROSUVASTATIN CALCIUM 5 MG PO TABS: ORAL_TABLET | ORAL | 0 refills | Status: DC

## 2019-11-13 MED ORDER — LOSARTAN POTASSIUM 100 MG PO TABS: ORAL_TABLET | ORAL | 0 refills | Status: DC

## 2019-11-13 MED ORDER — INDAPAMIDE 2.5 MG PO TABS: ORAL_TABLET | ORAL | 0 refills | Status: DC

## 2019-11-13 NOTE — Progress Notes (Signed)
Patient ID: Shannon Ashley, female    DOB: 28-Aug-1965, 54 y.o.   MRN: [id number]   Chief Complaint  Patient presents with   Hypertension    Patient is dealing with a lot of stress and feels this is causing her high blood pressure at times. Diet has improved over the last couple of weeks and she is getting regular exercise. Patient doesn't like taking the trazodone every night but can tell a big difference in the way she feels the next day.    Hyperlipidemia   Subjective:    HPI  Pt seen for anxiety- Pt taking the xanax prn.  However has been filling 60 tab prescription. occ doesn't take it daily.  Has anxiety with work and stress with school and family issues.  Pt is taking celexa 32m daily.  Taking 588mtrazodone at night. Last fill of xanax 0,72m13m0 was on 09/10/19, and has 1 refill remaining.  Medical History Shannon Ashley has a past medical history of Anxiety, Hyperlipidemia, and Hypertension.   Outpatient Encounter Medications as of 11/13/2019  Medication Sig   fluticasone (FLONASE) 50 MCG/ACT nasal spray USE TWO SPRAYS IN BOTH NOSTRILS DAILY   ALPRAZolam (XANAX) 0.5 MG tablet TAKE ONE TABLET (0.5MG TOTAL) BY MOUTH TWO TIMES DAILY AS NEEDED FOR ANXIETY   BIOTIN PO Take 1 tablet by mouth daily.  (Patient not taking: Reported on 11/13/2019)   cetirizine (ZYRTEC) 10 MG tablet Take 10 mg by mouth daily as needed for allergies.    Cyanocobalamin (VITAMIN B-12 PO) Take 1 each by mouth once a week.   escitalopram (LEXAPRO) 20 MG tablet TAKE ONE (1) TABLET BY MOUTH EVERY DAY   indapamide (LOZOL) 2.5 MG tablet TAKE ONE TABLET (2.5MG TOTAL) BY MOUTH DAILY   losartan (COZAAR) 100 MG tablet TAKE ONE TABLET (100MG TOTAL) BY MOUTH DAILY   meloxicam (MOBIC) 15 MG tablet Take 1 tablet (15 mg total) by mouth daily. Prn ankle pain (Patient taking differently: Take 15 mg by mouth daily as needed for pain. )   Multiple Vitamin (MULTIVITAMIN) tablet Take 1 tablet by mouth daily.    rosuvastatin (CRESTOR) 5 MG tablet TAKE ONE TABLET (5MG TOTAL) BY MOUTH DAILY FOR CHOLESTEROL   traZODone (DESYREL) 50 MG tablet TAKE ONE TABLET BY MOUTH EVERY NIGHT AT BEDTIME FOR SLEEP (Patient taking differently: Take 50 mg by mouth at bedtime as needed for sleep. )   [DISCONTINUED] indapamide (LOZOL) 2.5 MG tablet TAKE ONE TABLET (2.5MG TOTAL) BY MOUTH DAILY   [DISCONTINUED] losartan (COZAAR) 100 MG tablet TAKE ONE TABLET (100MG TOTAL) BY MOUTH DAILY   [DISCONTINUED] rosuvastatin (CRESTOR) 5 MG tablet TAKE ONE TABLET (5MG TOTAL) BY MOUTH DAILY FOR CHOLESTEROL   No facility-administered encounter medications on file as of 11/13/2019.     Review of Systems  Constitutional: Negative for chills and fever.  HENT: Negative for congestion, rhinorrhea and sore throat.   Respiratory: Negative for cough, shortness of breath and wheezing.   Cardiovascular: Negative for chest pain and leg swelling.  Gastrointestinal: Negative for abdominal pain, diarrhea, nausea and vomiting.  Genitourinary: Negative for dysuria and frequency.  Musculoskeletal: Negative for arthralgias and back pain.  Skin: Negative for rash.  Neurological: Negative for dizziness, weakness and headaches.     Vitals BP 118/74    Pulse 92    Temp 97.7 F (36.5 C)    Wt 199 lb 6.4 oz (90.4 kg)    SpO2 99%    BMI 31.70 kg/m   Objective:  Physical Exam -no PE pt left before exam complete.  Assessment and Plan   1. Anxiety  2. Hypertension, unspecified type - CBC - CMP14+EGFR - Lipid panel  3. Hyperlipidemia, unspecified hyperlipidemia type - CBC - CMP14+EGFR - Lipid panel  4. High risk medication use   Pt was very argumentative in room and demanding refill of xanax and declining referral to psychiatry. Pt not wanting to taper off the xanax and not wanting to try another medication for anxiety. Offered to give pt 3 month of refills of xanax, and with the discussion on follow up about trying other medications  or tapering off the xanax.   Pt declining this option.  Pt felt that I "wasn't listening" to her.  And was upset when trying to explain what my philosophy and treatment of anxiety and depression.  Pt felt that "people of color" aren't being listened to in the community.  I assured pt I am listening to her, but don't believe in treating anxiety with long term benzodiazepines.  Discussed risk vs. Benefits of taking benzodiazepines over long periods of time.  I decided that it was best to let pt go and find another provider.  It didn't seem to be a trusting therapeutic relationship.   Will give 35moe days on blood pressure and cholesterol medication to give her 30 days of meds and emergency treatment.

## 2019-11-26 ENCOUNTER — Encounter: Payer: Self-pay | Admitting: Family Medicine

## 2019-11-27 ENCOUNTER — Ambulatory Visit: Payer: BC Managed Care – PPO | Admitting: Nurse Practitioner

## 2019-12-02 DIAGNOSIS — M79671 Pain in right foot: Secondary | ICD-10-CM | POA: Diagnosis not present

## 2019-12-02 DIAGNOSIS — M25571 Pain in right ankle and joints of right foot: Secondary | ICD-10-CM | POA: Diagnosis not present

## 2020-01-01 ENCOUNTER — Ambulatory Visit (INDEPENDENT_AMBULATORY_CARE_PROVIDER_SITE_OTHER): Payer: BC Managed Care – PPO | Admitting: Adult Health

## 2020-01-01 ENCOUNTER — Encounter: Payer: Self-pay | Admitting: Adult Health

## 2020-01-01 VITALS — BP 105/73 | Ht 67.0 in | Wt 197.0 lb

## 2020-01-01 DIAGNOSIS — Z01419 Encounter for gynecological examination (general) (routine) without abnormal findings: Secondary | ICD-10-CM | POA: Diagnosis not present

## 2020-01-01 DIAGNOSIS — F419 Anxiety disorder, unspecified: Secondary | ICD-10-CM | POA: Diagnosis not present

## 2020-01-01 DIAGNOSIS — Z79890 Hormone replacement therapy (postmenopausal): Secondary | ICD-10-CM

## 2020-01-01 DIAGNOSIS — Z1211 Encounter for screening for malignant neoplasm of colon: Secondary | ICD-10-CM | POA: Diagnosis not present

## 2020-01-01 DIAGNOSIS — R232 Flushing: Secondary | ICD-10-CM

## 2020-01-01 LAB — HEMOCCULT GUIAC POC 1CARD (OFFICE): Fecal Occult Blood, POC: NEGATIVE

## 2020-01-01 MED ORDER — ESCITALOPRAM OXALATE 20 MG PO TABS: 20.0000 mg | ORAL_TABLET | Freq: Every day | ORAL | 6 refills | Status: DC

## 2020-01-01 MED ORDER — ESTRADIOL 1 MG PO TABS: 1.0000 mg | ORAL_TABLET | Freq: Every day | ORAL | 6 refills | Status: DC

## 2020-01-01 MED ORDER — PROGESTERONE 200 MG PO CAPS: ORAL_CAPSULE | ORAL | 6 refills | Status: DC

## 2020-01-01 NOTE — Progress Notes (Signed)
Patient ID: Shannon Ashley, female   DOB: [birth year], 54 y.o.   MRN: [id number] History of Present Illness: Shannon Ashley is a 54 year old black female, divorced, [id number], in for a well woman gyn exam. She had a normal pap with negative HPV 07/11/2018. She is having hot flashes and night sweats and is moody, but has been out of lexapro for several weeks, and not sleeping well. She says she was dismissed from Dr Tanna Furry practice, not sure why.She is looking for PCP. She works for [employer] for [employer] and is going to college.   Current Medications, Allergies, Past Medical History, Past Surgical History, Family History and Social History were reviewed in [facility] record.     Review of Systems: Patient denies any headaches, hearing loss, fatigue, blurred vision, shortness of breath, chest pain, abdominal pain, problems with bowel movements, urination, or intercourse. No joint pain. See HPI for positives.     Physical Exam:BP 105/73 (BP Location: Left Arm, Patient Position: Sitting, Cuff Size: Normal)   Ht 5\' 7"  (1.702 m)   Wt 197 lb (89.4 kg)   BMI 30.85 kg/m  General:  Well developed, well nourished, no acute distress Skin:  Warm and dry Neck:  Midline trachea, normal thyroid, good ROM, no lymphadenopathy Lungs; Clear to auscultation bilaterally Breast:  No dominant palpable mass, retraction, or nipple discharge Cardiovascular: Regular rate and rhythm Abdomen:  Soft, non tender, no hepatosplenomegaly Pelvic:  External genitalia is normal in appearance, no lesions.  The vagina is normal in appearance. Urethra has no lesions or masses. The cervix is bulbous.  Uterus is felt to be normal size, shape, and contour.  No adnexal masses or tenderness noted.Bladder is non tender, no masses felt. Rectal: Good sphincter tone, no polyps, or hemorrhoids felt.  Hemoccult negative. Extremities/musculoskeletal:  No swelling or varicosities noted, no clubbing or cyanosis Psych:  No  mood changes, alert and cooperative,seems happy AA is 3 Fall risk is low PHQ 9 score is 13, no SI, will refill lexapro.   [facility] - 01/01/20 1053      Pregnancy Intention Screening   Does the patient want to become pregnant in the next year? N/A    Does the patient's partner want to become pregnant in the next year? N/A    Would the patient like to discuss contraceptive options today? N/A      Contraception Wrap Up   Current Method Female Sterilization    End Method Female Sterilization    Contraception Counseling Provided No         Examination chaperoned by Dwyane Dee LPN  Impression and Plan:  1. Encounter for well woman exam with routine gynecological exam Physical in 1 year Pap in 2023 Mammogram yearly Colonoscopy per GI Call Dr Buelah Manis for PCP  2. Encounter for screening fecal occult blood testing  3. Anxiety Will refill  lexapro Meds ordered this encounter  Medications  . escitalopram (LEXAPRO) 20 MG tablet    Sig: Take 1 tablet (20 mg total) by mouth daily.    Dispense:  30 tablet    Refill:  6    Order Specific Question:   Supervising Provider    Answer:   Elonda Husky, LUTHER H [[id number]]  . estradiol (ESTRACE) 1 MG tablet    Sig: Take 1 tablet (1 mg total) by mouth daily.    Dispense:  30 tablet    Refill:  6    Order Specific Question:   Supervising Provider    Answer:  EURE, LUTHER H [[id number]]  . progesterone (PROMETRIUM) 200 MG capsule    Sig: Take 1 at bedtime    Dispense:  30 capsule    Refill:  6    Order Specific Question:   Supervising Provider    Answer:   Elonda Husky, LUTHER H [[id number]]    4. Hot flashes Discussed trying HRT, aware of the risks and benefits  5. Hormone replacement therapy (HRT) Follow up in 3 months or sooner if needed

## 2020-01-02 DIAGNOSIS — M25571 Pain in right ankle and joints of right foot: Secondary | ICD-10-CM | POA: Diagnosis not present

## 2020-01-05 DIAGNOSIS — M25571 Pain in right ankle and joints of right foot: Secondary | ICD-10-CM | POA: Diagnosis not present

## 2020-01-05 DIAGNOSIS — M79671 Pain in right foot: Secondary | ICD-10-CM | POA: Diagnosis not present

## 2020-01-08 ENCOUNTER — Other Ambulatory Visit: Payer: Self-pay | Admitting: Family Medicine

## 2020-01-08 ENCOUNTER — Telehealth: Payer: Self-pay | Admitting: Adult Health

## 2020-01-08 MED ORDER — ROSUVASTATIN CALCIUM 5 MG PO TABS: ORAL_TABLET | ORAL | 3 refills | Status: DC

## 2020-01-08 MED ORDER — INDAPAMIDE 2.5 MG PO TABS: ORAL_TABLET | ORAL | 3 refills | Status: DC

## 2020-01-08 MED ORDER — LOSARTAN POTASSIUM 100 MG PO TABS: ORAL_TABLET | ORAL | 3 refills | Status: DC

## 2020-01-08 NOTE — Telephone Encounter (Signed)
[facility] called stating that they sent over a  Request for three medication for the patient and it stated that she was dismissed from practice. I checked in her chart and pt has not been dismissed from practice. They are needing a refill of Crestor 5 mg; Losartan 100 mg and Indapamide 2.5 mg.

## 2020-01-08 NOTE — Addendum Note (Signed)
Addended by: Derrek Monaco A on: 01/08/2020 12:37 PM   Modules accepted: Orders

## 2020-01-08 NOTE — Telephone Encounter (Signed)
Refilled lozol,crestor and cozaar to [facility]

## 2020-01-10 DIAGNOSIS — Z20822 Contact with and (suspected) exposure to covid-19: Secondary | ICD-10-CM | POA: Diagnosis not present

## 2020-01-26 ENCOUNTER — Encounter (HOSPITAL_COMMUNITY): Payer: Self-pay | Admitting: Physical Therapy

## 2020-01-26 ENCOUNTER — Other Ambulatory Visit: Payer: Self-pay

## 2020-01-26 ENCOUNTER — Ambulatory Visit (HOSPITAL_COMMUNITY): Payer: BC Managed Care – PPO | Attending: Sports Medicine | Admitting: Physical Therapy

## 2020-01-26 DIAGNOSIS — M25571 Pain in right ankle and joints of right foot: Secondary | ICD-10-CM | POA: Diagnosis not present

## 2020-01-26 DIAGNOSIS — M6281 Muscle weakness (generalized): Secondary | ICD-10-CM | POA: Insufficient documentation

## 2020-01-26 DIAGNOSIS — M25671 Stiffness of right ankle, not elsewhere classified: Secondary | ICD-10-CM

## 2020-01-26 DIAGNOSIS — R2689 Other abnormalities of gait and mobility: Secondary | ICD-10-CM | POA: Insufficient documentation

## 2020-01-26 NOTE — Patient Instructions (Signed)
Heel Raise (Sitting)    Raise heels, keeping toes on floor. Repeat _10___ times per set. Do __1__ sets per session. Do _1-2___ sessions per day.  http://orth.exer.us/45   Copyright  VHI. All rights reserved.  Inversion (Isometric)    Sitting, press inside of right foot against stable furniture, without ankle movement. 10 second hold Repeat __10__ times. Do __1-2__ sessions per day.  http://gt2.exer.us/434   Copyright  VHI. All rights reserved.  Plantar Flexion: Isometric    Press left foot into ball or rolled pillow against wall. Hold _10___ seconds. Relax. Repeat _10___ times per set. Do _1___ sets per session. Do _1-2___ sessions per day.  http://orth.exer.us/1   Copyright  VHI. All rights reserved.
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
## 2020-01-28 ENCOUNTER — Other Ambulatory Visit: Payer: Self-pay

## 2020-01-28 ENCOUNTER — Ambulatory Visit (HOSPITAL_COMMUNITY): Payer: BC Managed Care – PPO | Attending: Sports Medicine | Admitting: Physical Therapy

## 2020-01-28 DIAGNOSIS — M25671 Stiffness of right ankle, not elsewhere classified: Secondary | ICD-10-CM

## 2020-01-28 DIAGNOSIS — M6281 Muscle weakness (generalized): Secondary | ICD-10-CM | POA: Insufficient documentation

## 2020-01-28 DIAGNOSIS — M25571 Pain in right ankle and joints of right foot: Secondary | ICD-10-CM | POA: Insufficient documentation

## 2020-01-28 DIAGNOSIS — R2689 Other abnormalities of gait and mobility: Secondary | ICD-10-CM

## 2020-01-28 NOTE — Therapy (Signed)
[place], [place], [address] Phone: [phone]   Fax:  [phone]  Physical Therapy Treatment  Patient Details  Name: Shannon Ashley MRN: [id number] Date of Birth: 07/22/1965 Referring Provider (PT): Vickki Hearing, MD   Encounter Date: 01/28/2020   PT End of Session - 01/28/20 1659    Visit Number 2    Number of Visits 12    Date for PT Re-Evaluation 03/08/20    Authorization Type BCBS (no auth required)    Authorization - Visit Number 2    Authorization - Number of Visits 60    PT Start Time 2202    PT Stop Time 1700    PT Time Calculation (min) 40 min    Activity Tolerance Patient tolerated treatment well;No increased pain    Behavior During Therapy WFL for tasks assessed/performed           Past Medical History:  Diagnosis Date  . Anxiety   . Hyperlipidemia   . Hypertension     Past Surgical History:  Procedure Laterality Date  . COLONOSCOPY N/A 10/13/2014   Procedure: COLONOSCOPY;  Surgeon: Daneil Dolin, MD;  [facility];  Service: Endoscopy;  Laterality: N/A;  830   . COLONOSCOPY N/A 12/02/2018   Procedure: COLONOSCOPY;  Surgeon: Daneil Dolin, MD;  [facility];  Service: Endoscopy;  Laterality: N/A;  8:30am  . ECTOPIC PREGNANCY SURGERY    . ENDOMETRIAL ABLATION    . POLYPECTOMY  12/02/2018   Procedure: POLYPECTOMY;  Surgeon: Daneil Dolin, MD;  [facility];  Service: Endoscopy;;  colon  . TUBAL LIGATION      There were no vitals filed for this visit.   Subjective Assessment - 01/28/20 1620    Subjective Pt states no difficulty with exercises.    Pertinent History See above    Limitations House hold activities;Standing;Walking    How long can you stand comfortably? 15-20 minutes    How long can you walk comfortably? 15-20 minutes    Diagnostic tests MRI    Patient Stated Goals To have less pain    Currently in Pain? No/denies                              [facility] Adult PT Treatment/Exercise - 01/28/20 0001      Exercises   Exercises Ankle      Ankle Exercises: Seated   ABC's 1 rep    Towel Crunch 3 reps    Heel Raises 10 reps    Toe Raise 10 reps    BAPS Sitting;Level 2    Other Seated Ankle Exercises sitting isometrics x 10                     PT Short Term Goals - 01/28/20 1623      PT SHORT TERM GOAL #1   Title Patient will report understanding and regular compliance with HEP to improve strength, mobility and decrease pain.    Time 3    Period Weeks    Status On-going    Target Date 02/16/20      PT SHORT TERM GOAL #2   Title Patient will report improvement in overall subjective complaint of at least 25% for improved QOL.    Time 3    Period Weeks    Status On-going    Target Date 02/16/20  PT Long Term Goals - 01/28/20 1624      PT LONG TERM GOAL #1   Title Patient will report improvement in overall subjective complaint of at least 50% for improved QOL.    Time 6    Period Weeks    Status On-going      PT LONG TERM GOAL #2   Title Patient will report ability to stand for at least 30 minutes in order to cook a meal without increased pain.    Time 6    Period Weeks    Status On-going      PT LONG TERM GOAL #3   Title Patient will demonstrate improvement in RT PF strength of at least 1/2 MMT strength grade in order to improve functional mobility and push-off with ambulation.    Time 6    Period Weeks    Status On-going      PT LONG TERM GOAL #4   Title Patient will demonstrate ability to perform SLS for at least 10 seconds on each LE indicating improved stability and balance.    Time 6    Period Weeks    Status On-going      PT LONG TERM GOAL #5   Title Patient will demonstrate RT ankle AROM WFL for improved gait mechanics and decreased pain.    Time 6    Period Weeks    Status On-going                 Plan - 01/28/20 1656     Clinical Impression Statement Added ankle alphabet, isometrics for all motions to HEP.  PT able to complete all exercises with good form.    Personal Factors and Comorbidities Time since onset of injury/illness/exacerbation;Comorbidity 3+    Comorbidities HTN, anxiety, depression    Examination-Activity Limitations Stand;Locomotion Level;Squat;Stairs    Examination-Participation Restrictions Cleaning;Meal Prep;Yard Work;Community Activity;Shop;Laundry    Stability/Clinical Decision Making Stable/Uncomplicated    Rehab Potential Good    PT Frequency 2x / week    PT Duration 6 weeks    PT Treatment/Interventions ADLs/Self Care Home Management;Aquatic Therapy;Cryotherapy;Electrical Stimulation;Moist Heat;Ultrasound;DME Instruction;Gait training;Stair training;Functional mobility training;Therapeutic activities;Therapeutic exercise;Balance training;Neuromuscular re-education;Patient/family education;Orthotic Fit/Training;Prosthetic Training;Manual techniques;Passive range of motion;Dry needling;Energy conservation;Splinting;Taping    PT Next Visit Plan Begin total gym plantarflexion pain free with pt dropping heels to acquire a stretch, teach Gastrc stretch and gfive as a HEPl Progress to concentric and eccentric strengthening as able. Joint mobilization and STM as needed for pain relief. Hip strengthening to reduce stress on ankles.    PT Home Exercise Plan 01/26/20: seated heel raise, isometric ankle inversion and plantarflexion; 9/2 isometric for eversion and DF; Ankle alphabet    Consulted and Agree with Plan of Care Patient           Patient will benefit from skilled therapeutic intervention in order to improve the following deficits and impairments:  Abnormal gait, Improper body mechanics, Pain, Decreased mobility, Decreased activity tolerance, Decreased endurance, Decreased range of motion, Decreased strength, Decreased balance, Difficulty walking, Increased edema  Visit Diagnosis: Pain in  right ankle and joints of right foot  Stiffness of right ankle, not elsewhere classified  Other abnormalities of gait and mobility  Muscle weakness (generalized)     Problem List Patient Active Problem List   Diagnosis Date Noted  . Encounter for screening fecal occult blood testing 01/01/2020  . Encounter for well woman exam with routine gynecological exam 01/01/2020  . Screening for colorectal cancer 07/11/2018  . Encounter  for gynecological examination with Papanicolaou smear of cervix 07/11/2018  . Menopause present, declines hormone replacement therapy 07/11/2018  . Depression, major, single episode, mild ([place]) 04/23/2018  . Spotting 08/08/2016  . Pelvic cramping 08/08/2016  . Encounter for routine gynecological examination with Papanicolaou smear of cervix 08/08/2016  . Mixed stress and urge urinary incontinence 08/08/2016  . Vitamin D deficiency 02/03/2016  . History of colonic polyps   . Diverticulosis of colon without hemorrhage   . Abdominal pain 09/30/2014  . Bowel habit changes 09/21/2014  . Rectal bleeding 09/21/2014  . Hemorrhoids 09/21/2014  . Hyperlipidemia 08/31/2014  . Anxiety 07/07/2013  . Hypertension 07/07/2013  . Elevated cholesterol 07/07/2013    Rayetta Humphrey, PT CLT [phone] 01/28/2020, 5:00 PM  [place] [address] [place], [place], [address] Phone: [phone]   Fax:  [phone]  Name: LURETHA EBERLY MRN: [id number] Date of Birth: March 18, 1966

## 2020-01-29 DIAGNOSIS — Z03818 Encounter for observation for suspected exposure to other biological agents ruled out: Secondary | ICD-10-CM | POA: Diagnosis not present

## 2020-01-29 DIAGNOSIS — Z20822 Contact with and (suspected) exposure to covid-19: Secondary | ICD-10-CM | POA: Diagnosis not present

## 2020-02-10 ENCOUNTER — Encounter (HOSPITAL_COMMUNITY): Payer: BC Managed Care – PPO | Admitting: Physical Therapy

## 2020-02-10 ENCOUNTER — Telehealth (HOSPITAL_COMMUNITY): Payer: Self-pay | Admitting: Physical Therapy

## 2020-02-10 NOTE — Telephone Encounter (Signed)
First no show.  Attempted to call pt to remind pt of her next visit but there was no answer and her voice box has not been set up at this time.  Rayetta Humphrey, [place] CLT [phone]

## 2020-02-11 ENCOUNTER — Telehealth (HOSPITAL_COMMUNITY): Payer: Self-pay | Admitting: Physical Therapy

## 2020-02-11 NOTE — Telephone Encounter (Signed)
pt cancelled appt foir 9/17 because she has a conflict in her work schedule

## 2020-02-12 ENCOUNTER — Encounter (HOSPITAL_COMMUNITY): Payer: BC Managed Care – PPO | Admitting: Physical Therapy

## 2020-02-16 ENCOUNTER — Other Ambulatory Visit: Payer: Self-pay

## 2020-02-16 ENCOUNTER — Ambulatory Visit (HOSPITAL_COMMUNITY): Payer: BC Managed Care – PPO | Admitting: Physical Therapy

## 2020-02-16 ENCOUNTER — Encounter (HOSPITAL_COMMUNITY): Payer: Self-pay | Admitting: Physical Therapy

## 2020-02-16 DIAGNOSIS — M6281 Muscle weakness (generalized): Secondary | ICD-10-CM

## 2020-02-16 DIAGNOSIS — M25671 Stiffness of right ankle, not elsewhere classified: Secondary | ICD-10-CM | POA: Diagnosis not present

## 2020-02-16 DIAGNOSIS — R2689 Other abnormalities of gait and mobility: Secondary | ICD-10-CM | POA: Diagnosis not present

## 2020-02-16 DIAGNOSIS — M25571 Pain in right ankle and joints of right foot: Secondary | ICD-10-CM

## 2020-02-16 NOTE — Patient Instructions (Signed)
Calf Stretch    Stand with hands supported on wall, elbows slightly bent, front knee bent, back knee straight, feet parallel and both heels on floor. Lean into wall by pushing hips forward until a stretch is felt in calf muscle. Hold _30___ seconds. Repeat with leg positions switched. Repeat 2-3 times. Perform 1-2 times a day.   Copyright  VHI. All rights reserved.

## 2020-02-16 NOTE — Therapy (Signed)
[place], [place], [address] Phone: [phone]   Fax:  [phone]  Physical Therapy Treatment  Patient Details  Name: RIONNA FELTES MRN: [id number] Date of Birth: 1966-03-30 Referring Provider (PT): Vickki Hearing, MD   Encounter Date: 02/16/2020   PT End of Session - 02/16/20 1029    Visit Number 3    Number of Visits 12    Date for PT Re-Evaluation 03/08/20    Authorization Type BCBS (no auth required)    Authorization - Visit Number 3    Authorization - Number of Visits 72    PT Start Time [phone]   PT arrived late   PT Stop Time 1030    PT Time Calculation (min) 32 min    Activity Tolerance Patient tolerated treatment well;No increased pain    Behavior During Therapy WFL for tasks assessed/performed           Past Medical History:  Diagnosis Date  . Anxiety   . Hyperlipidemia   . Hypertension     Past Surgical History:  Procedure Laterality Date  . COLONOSCOPY N/A 10/13/2014   Procedure: COLONOSCOPY;  Surgeon: Daneil Dolin, MD;  [facility];  Service: Endoscopy;  Laterality: N/A;  830   . COLONOSCOPY N/A 12/02/2018   Procedure: COLONOSCOPY;  Surgeon: Daneil Dolin, MD;  [facility];  Service: Endoscopy;  Laterality: N/A;  8:30am  . ECTOPIC PREGNANCY SURGERY    . ENDOMETRIAL ABLATION    . POLYPECTOMY  12/02/2018   Procedure: POLYPECTOMY;  Surgeon: Daneil Dolin, MD;  [facility];  Service: Endoscopy;;  colon  . TUBAL LIGATION      There were no vitals filed for this visit.   Subjective Assessment - 02/16/20 1001    Subjective Patient denies any pain currently. She states she feels like it has improved a lot since beginning therapy.    Pertinent History See above    Limitations House hold activities;Standing;Walking    How long can you stand comfortably? 15-20 minutes    How long can you walk comfortably? 15-20 minutes    Diagnostic tests MRI     Patient Stated Goals To have less pain    Currently in Pain? No/denies                             [facility] Adult PT Treatment/Exercise - 02/16/20 0001      Ankle Exercises: Seated   Towel Crunch --   10 x   Heel Raises 10 reps    Toe Raise 10 reps    BAPS Sitting;Level 2;10 reps   DF/PF, INV/EV     Ankle Exercises: Stretches   Gastroc Stretch 2 reps;30 seconds   at wall     Ankle Exercises: Machines for Strengthening   Cybex Leg Press --   Power tower set at 28 degrees heel raise x 15 bil LEs                 PT Education - 02/16/20 1029    Education Details Updated HEP.    Person(s) Educated Patient    Methods Explanation;Handout    Comprehension Verbalized understanding            PT Short Term Goals - 01/28/20 1623      PT SHORT TERM GOAL #1   Title Patient will report understanding and regular compliance with  HEP to improve strength, mobility and decrease pain.    Time 3    Period Weeks    Status On-going    Target Date 02/16/20      PT SHORT TERM GOAL #2   Title Patient will report improvement in overall subjective complaint of at least 25% for improved QOL.    Time 3    Period Weeks    Status On-going    Target Date 02/16/20             PT Long Term Goals - 01/28/20 1624      PT LONG TERM GOAL #1   Title Patient will report improvement in overall subjective complaint of at least 50% for improved QOL.    Time 6    Period Weeks    Status On-going      PT LONG TERM GOAL #2   Title Patient will report ability to stand for at least 30 minutes in order to cook a meal without increased pain.    Time 6    Period Weeks    Status On-going      PT LONG TERM GOAL #3   Title Patient will demonstrate improvement in RT PF strength of at least 1/2 MMT strength grade in order to improve functional mobility and push-off with ambulation.    Time 6    Period Weeks    Status On-going      PT LONG TERM GOAL #4   Title Patient will  demonstrate ability to perform SLS for at least 10 seconds on each LE indicating improved stability and balance.    Time 6    Period Weeks    Status On-going      PT LONG TERM GOAL #5   Title Patient will demonstrate RT ankle AROM WFL for improved gait mechanics and decreased pain.    Time 6    Period Weeks    Status On-going                 Plan - 02/16/20 1033    Clinical Impression Statement Added heel raises on power tower this session to increase plantarflexion strength with decreased force of gravity. Patient demonstrated good form, and reported a good challenge with this exercise. Educated patient on a calf stretch and provided for HEP. Cued patient with this for neutral heel alignment with patient demonstrating ability to correct. Patient would benefit from continued skilled physical therapy in order to continue progressing towards functional goals.    Personal Factors and Comorbidities Time since onset of injury/illness/exacerbation;Comorbidity 3+    Comorbidities HTN, anxiety, depression    Examination-Activity Limitations Stand;Locomotion Level;Squat;Stairs    Examination-Participation Restrictions Cleaning;Meal Prep;Yard Work;Community Activity;Shop;Laundry    Stability/Clinical Decision Making Stable/Uncomplicated    Rehab Potential Good    PT Frequency 2x / week    PT Duration 6 weeks    PT Treatment/Interventions ADLs/Self Care Home Management;Aquatic Therapy;Cryotherapy;Electrical Stimulation;Moist Heat;Ultrasound;DME Instruction;Gait training;Stair training;Functional mobility training;Therapeutic activities;Therapeutic exercise;Balance training;Neuromuscular re-education;Patient/family education;Orthotic Fit/Training;Prosthetic Training;Manual techniques;Passive range of motion;Dry needling;Energy conservation;Splinting;Taping    PT Next Visit Plan Progress to concentric and eccentric strengthening as able. Joint mobilization and STM as needed for pain relief. Hip  strengthening to reduce stress on ankles.    PT Home Exercise Plan 01/26/20: seated heel raise, isometric ankle inversion and plantarflexion; 9/2 isometric for eversion and DF; Ankle alphabet; 02/16/20: Gastroc stretch at wall    Consulted and Agree with Plan of Care Patient  Patient will benefit from skilled therapeutic intervention in order to improve the following deficits and impairments:  Abnormal gait, Improper body mechanics, Pain, Decreased mobility, Decreased activity tolerance, Decreased endurance, Decreased range of motion, Decreased strength, Decreased balance, Difficulty walking, Increased edema  Visit Diagnosis: Pain in right ankle and joints of right foot  Stiffness of right ankle, not elsewhere classified  Other abnormalities of gait and mobility  Muscle weakness (generalized)     Problem List Patient Active Problem List   Diagnosis Date Noted  . Encounter for screening fecal occult blood testing 01/01/2020  . Encounter for well woman exam with routine gynecological exam 01/01/2020  . Screening for colorectal cancer 07/11/2018  . Encounter for gynecological examination with Papanicolaou smear of cervix 07/11/2018  . Menopause present, declines hormone replacement therapy 07/11/2018  . Depression, major, single episode, mild ([place]) 04/23/2018  . Spotting 08/08/2016  . Pelvic cramping 08/08/2016  . Encounter for routine gynecological examination with Papanicolaou smear of cervix 08/08/2016  . Mixed stress and urge urinary incontinence 08/08/2016  . Vitamin D deficiency 02/03/2016  . History of colonic polyps   . Diverticulosis of colon without hemorrhage   . Abdominal pain 09/30/2014  . Bowel habit changes 09/21/2014  . Rectal bleeding 09/21/2014  . Hemorrhoids 09/21/2014  . Hyperlipidemia 08/31/2014  . Anxiety 07/07/2013  . Hypertension 07/07/2013  . Elevated cholesterol 07/07/2013   Clarene Critchley PT, DPT 10:35 AM, 02/16/20 [place] [address] [place], [place], [address] Phone: [phone]   Fax:  [phone]  Name: BRYNLEA SPINDLER MRN: [id number] Date of Birth: 1965-08-21

## 2020-02-18 ENCOUNTER — Telehealth (HOSPITAL_COMMUNITY): Payer: Self-pay | Admitting: Physical Therapy

## 2020-02-18 ENCOUNTER — Ambulatory Visit (HOSPITAL_COMMUNITY): Payer: BC Managed Care – PPO | Admitting: Physical Therapy

## 2020-02-18 NOTE — Telephone Encounter (Signed)
First no show  Called pt re no show.  Pt forgot to call as she got called into work early this morning.  Rayetta Humphrey, [place] CLT [phone]

## 2020-02-23 ENCOUNTER — Other Ambulatory Visit: Payer: Self-pay

## 2020-02-23 ENCOUNTER — Ambulatory Visit (HOSPITAL_COMMUNITY): Payer: BC Managed Care – PPO | Admitting: Physical Therapy

## 2020-02-23 ENCOUNTER — Encounter (HOSPITAL_COMMUNITY): Payer: Self-pay | Admitting: Physical Therapy

## 2020-02-23 DIAGNOSIS — R2689 Other abnormalities of gait and mobility: Secondary | ICD-10-CM | POA: Diagnosis not present

## 2020-02-23 DIAGNOSIS — M25571 Pain in right ankle and joints of right foot: Secondary | ICD-10-CM

## 2020-02-23 DIAGNOSIS — M6281 Muscle weakness (generalized): Secondary | ICD-10-CM

## 2020-02-23 DIAGNOSIS — M25671 Stiffness of right ankle, not elsewhere classified: Secondary | ICD-10-CM

## 2020-02-23 NOTE — Therapy (Signed)
[place], [place], [address] Phone: [phone]   Fax:  [phone]  Physical Therapy Treatment  Patient Details  Name: Shannon Ashley MRN: [id number] Date of Birth: Nov 30, 1965 Referring Provider (PT): Vickki Hearing, MD   Encounter Date: 02/23/2020   PT End of Session - 02/23/20 1454    Visit Number 4    Number of Visits 12    Date for PT Re-Evaluation 03/08/20    Authorization Type BCBS (no auth required)    Authorization - Visit Number 4    Authorization - Number of Visits 60    PT Start Time 9983   arrives late   PT Stop Time 1528    PT Time Calculation (min) 33 min    Activity Tolerance Patient tolerated treatment well;No increased pain    Behavior During Therapy [facility] for tasks assessed/performed           Past Medical History:  Diagnosis Date   Anxiety    Hyperlipidemia    Hypertension     Past Surgical History:  Procedure Laterality Date   COLONOSCOPY N/A 10/13/2014   Procedure: COLONOSCOPY;  Surgeon: Daneil Dolin, MD;  [facility];  Service: Endoscopy;  Laterality: N/A;  830    COLONOSCOPY N/A 12/02/2018   Procedure: COLONOSCOPY;  Surgeon: Daneil Dolin, MD;  [facility];  Service: Endoscopy;  Laterality: N/A;  8:30am   ECTOPIC PREGNANCY SURGERY     ENDOMETRIAL ABLATION     POLYPECTOMY  12/02/2018   Procedure: POLYPECTOMY;  Surgeon: Daneil Dolin, MD;  [facility];  Service: Endoscopy;;  colon   TUBAL LIGATION      There were no vitals filed for this visit.   Subjective Assessment - 02/23/20 1453    Subjective Patient states she has been feeling pretty good. She is not back to 100%.    Pertinent History See above    Limitations House hold activities;Standing;Walking    How long can you stand comfortably? 15-20 minutes    How long can you walk comfortably? 15-20 minutes    Diagnostic tests MRI    Patient Stated Goals To have less pain     Currently in Pain? No/denies                             [facility] Adult PT Treatment/Exercise - 02/23/20 0001      Ankle Exercises: Stretches   Gastroc Stretch 2 reps;30 seconds      Ankle Exercises: Machines for Strengthening   Cybex Leg Press Power tower set at 28 degrees heel raise 2 x 15 bil LEs; squats on power tower 2x15      Ankle Exercises: Standing   Other Standing Ankle Exercises standing hip abd and extension 2x15 each                  PT Education - 02/23/20 1454    Education Details Patient educated on HEP, exercise mechanics, how to decrease wearing boot at home, possible need for compression garmets for edema    Person(s) Educated Patient    Methods Explanation;Demonstration    Comprehension Verbalized understanding;Returned demonstration            PT Short Term Goals - 01/28/20 1623      PT SHORT TERM GOAL #1   Title Patient will report understanding and regular compliance with HEP to improve strength,  mobility and decrease pain.    Time 3    Period Weeks    Status On-going    Target Date 02/16/20      PT SHORT TERM GOAL #2   Title Patient will report improvement in overall subjective complaint of at least 25% for improved QOL.    Time 3    Period Weeks    Status On-going    Target Date 02/16/20             PT Long Term Goals - 01/28/20 1624      PT LONG TERM GOAL #1   Title Patient will report improvement in overall subjective complaint of at least 50% for improved QOL.    Time 6    Period Weeks    Status On-going      PT LONG TERM GOAL #2   Title Patient will report ability to stand for at least 30 minutes in order to cook a meal without increased pain.    Time 6    Period Weeks    Status On-going      PT LONG TERM GOAL #3   Title Patient will demonstrate improvement in RT PF strength of at least 1/2 MMT strength grade in order to improve functional mobility and push-off with ambulation.    Time 6    Period  Weeks    Status On-going      PT LONG TERM GOAL #4   Title Patient will demonstrate ability to perform SLS for at least 10 seconds on each LE indicating improved stability and balance.    Time 6    Period Weeks    Status On-going      PT LONG TERM GOAL #5   Title Patient will demonstrate RT ankle AROM WFL for improved gait mechanics and decreased pain.    Time 6    Period Weeks    Status On-going                 Plan - 02/23/20 1455    Clinical Impression Statement Patient educated on proper activity at home without using boot and how to progress that. Patient requires min verbal cueing for mechanics of powertower exercise today and she tolerates increased reps with fatigue. She has visible edema today present throughout R ankle. Patient educated on elevation and exercise for edema reduction. Patient notes fatigue in hips and in R ankle with standing hip exercises. Patient will continue to benefit from skilled physical therapy in order to reduce impairment and improve function.    Personal Factors and Comorbidities Time since onset of injury/illness/exacerbation;Comorbidity 3+    Comorbidities HTN, anxiety, depression    Examination-Activity Limitations Stand;Locomotion Level;Squat;Stairs    Examination-Participation Restrictions Cleaning;Meal Prep;Yard Work;Community Activity;Shop;Laundry    Stability/Clinical Decision Making Stable/Uncomplicated    Rehab Potential Good    PT Frequency 2x / week    PT Duration 6 weeks    PT Treatment/Interventions ADLs/Self Care Home Management;Aquatic Therapy;Cryotherapy;Electrical Stimulation;Moist Heat;Ultrasound;DME Instruction;Gait training;Stair training;Functional mobility training;Therapeutic activities;Therapeutic exercise;Balance training;Neuromuscular re-education;Patient/family education;Orthotic Fit/Training;Prosthetic Training;Manual techniques;Passive range of motion;Dry needling;Energy conservation;Splinting;Taping    PT Next Visit  Plan Progress to concentric and eccentric strengthening as able. Joint mobilization and STM as needed for pain relief. Hip strengthening to reduce stress on ankles.    PT Home Exercise Plan 01/26/20: seated heel raise, isometric ankle inversion and plantarflexion; 9/2 isometric for eversion and DF; Ankle alphabet; 02/16/20: Gastroc stretch at wall 9/28 hip abduction standing    Consulted and Agree  with Plan of Care Patient           Patient will benefit from skilled therapeutic intervention in order to improve the following deficits and impairments:  Abnormal gait, Improper body mechanics, Pain, Decreased mobility, Decreased activity tolerance, Decreased endurance, Decreased range of motion, Decreased strength, Decreased balance, Difficulty walking, Increased edema  Visit Diagnosis: Pain in right ankle and joints of right foot  Stiffness of right ankle, not elsewhere classified  Other abnormalities of gait and mobility  Muscle weakness (generalized)     Problem List Patient Active Problem List   Diagnosis Date Noted   Encounter for screening fecal occult blood testing 01/01/2020   Encounter for well woman exam with routine gynecological exam 01/01/2020   Screening for colorectal cancer 07/11/2018   Encounter for gynecological examination with Papanicolaou smear of cervix 07/11/2018   Menopause present, declines hormone replacement therapy 07/11/2018   Depression, major, single episode, mild ([place]) 04/23/2018   Spotting 08/08/2016   Pelvic cramping 08/08/2016   Encounter for routine gynecological examination with Papanicolaou smear of cervix 08/08/2016   Mixed stress and urge urinary incontinence 08/08/2016   Vitamin D deficiency 02/03/2016   History of colonic polyps    Diverticulosis of colon without hemorrhage    Abdominal pain 09/30/2014   Bowel habit changes 09/21/2014   Rectal bleeding 09/21/2014   Hemorrhoids 09/21/2014   Hyperlipidemia 08/31/2014    Anxiety 07/07/2013   Hypertension 07/07/2013   Elevated cholesterol 07/07/2013   3:26 PM, 02/23/20 Mearl Latin PT, DPT Physical Therapist at [place], [place], [address] Phone: [phone]   Fax:  [phone]  Name: FATIN BACHICHA MRN: [id number] Date of Birth: 1965-12-20

## 2020-02-23 NOTE — Patient Instructions (Signed)
Access Code: [email or web address] URL: [email or web address] Date: 02/23/2020 Prepared by: Mitzi Hansen Trystyn Dolley  Exercises Standing Hip Abduction with Counter Support - 1 x daily - 7 x weekly - 2 sets - 15 reps

## 2020-02-25 ENCOUNTER — Telehealth (HOSPITAL_COMMUNITY): Payer: Self-pay

## 2020-02-25 ENCOUNTER — Encounter (HOSPITAL_COMMUNITY): Payer: BC Managed Care – PPO

## 2020-02-25 NOTE — Telephone Encounter (Signed)
No show, called with no answer and no voice mail box available.  Ihor Austin, LPTA/CLT; Delana Meyer [phone]

## 2020-03-01 ENCOUNTER — Ambulatory Visit (HOSPITAL_COMMUNITY): Payer: BC Managed Care – PPO | Attending: Sports Medicine | Admitting: Physical Therapy

## 2020-03-03 ENCOUNTER — Ambulatory Visit (HOSPITAL_COMMUNITY): Payer: BC Managed Care – PPO | Admitting: Physical Therapy

## 2020-03-03 ENCOUNTER — Encounter (HOSPITAL_COMMUNITY): Payer: Self-pay | Admitting: Physical Therapy

## 2020-03-03 ENCOUNTER — Telehealth (HOSPITAL_COMMUNITY): Payer: Self-pay | Admitting: Physical Therapy

## 2020-03-03 NOTE — Telephone Encounter (Signed)
Called regarding patient not showing for therapy this date and on 03/01/20 also called to inform patient that per no-show policy patient would be discharged at this time. However, there was no answer, so plan to send patient a discharge letter.   Clarene Critchley PT, DPT 11:01 AM, 03/03/20 [phone]

## 2020-03-03 NOTE — Therapy (Signed)
[place], [place], [address] Phone: [phone]   Fax:  [phone]  Patient Details  Name: Shannon Ashley MRN: [id number] Date of Birth: [birth year] Referring Provider:  No ref. provider found  Encounter Date: 03/03/2020   PHYSICAL THERAPY DISCHARGE SUMMARY  Visits from Start of Care: 4  Current functional level related to goals / functional outcomes: Unknown as patient did not return to physical therapy for re-assessment  Remaining deficits: Unknown as patient did not return to physical therapy for re-assessment   Education / Equipment: HEP Plan:                                                    Patient goals were not met. Patient is being discharged due to                                                     ?????    Attendance policy and 3 no-shows  Clarene Critchley PT, DPT 11:04 AM, 03/03/20 [place], [place], [address] Phone: [phone]   Fax:  [phone]

## 2020-04-04 ENCOUNTER — Ambulatory Visit: Payer: BC Managed Care – PPO | Admitting: Adult Health

## 2020-09-01 ENCOUNTER — Other Ambulatory Visit: Payer: Self-pay

## 2020-09-01 ENCOUNTER — Ambulatory Visit: Payer: BC Managed Care – PPO | Attending: Internal Medicine | Admitting: Internal Medicine

## 2020-09-01 VITALS — BP 123/74 | HR 72 | Ht 67.0 in | Wt 198.2 lb

## 2020-09-01 DIAGNOSIS — I10 Essential (primary) hypertension: Secondary | ICD-10-CM | POA: Diagnosis not present

## 2020-09-01 DIAGNOSIS — F411 Generalized anxiety disorder: Secondary | ICD-10-CM | POA: Insufficient documentation

## 2020-09-01 DIAGNOSIS — E782 Mixed hyperlipidemia: Secondary | ICD-10-CM | POA: Diagnosis not present

## 2020-09-01 DIAGNOSIS — Z23 Encounter for immunization: Secondary | ICD-10-CM | POA: Diagnosis not present

## 2020-09-01 DIAGNOSIS — L720 Epidermal cyst: Secondary | ICD-10-CM

## 2020-09-01 DIAGNOSIS — F320 Major depressive disorder, single episode, mild: Secondary | ICD-10-CM

## 2020-09-01 DIAGNOSIS — Z1231 Encounter for screening mammogram for malignant neoplasm of breast: Secondary | ICD-10-CM

## 2020-09-01 DIAGNOSIS — Z7689 Persons encountering health services in other specified circumstances: Secondary | ICD-10-CM | POA: Diagnosis not present

## 2020-09-01 NOTE — Progress Notes (Signed)
Hypertension and anxiety

## 2020-09-01 NOTE — Patient Instructions (Signed)
Managing Stress, Adult Feeling a certain amount of stress is normal. Stress helps our body and mind get ready to deal with the demands of life. Stress hormones can motivate you to do well at work and meet your responsibilities. However severe or long-lasting (chronic) stress can affect your mental and physical health. Chronic stress puts you at higher risk for anxiety, depression, and other health problems like digestive problems, muscle aches, heart disease, high blood pressure, and stroke. What are the causes? Common causes of stress include:  Demands from work, such as deadlines, feeling overworked, or having long hours.  Pressures at home, such as money issues, disagreements with a spouse, or parenting issues.  Pressures from major life changes, such as divorce, moving, loss of a loved one, or chronic illness. You may be at higher risk for stress-related problems if you do not get enough sleep, are in poor health, do not have emotional support, or have a mental health disorder like anxiety or depression. How to recognize stress Stress can make you:  Have trouble sleeping.  Feel sad, anxious, irritable, or overwhelmed.  Lose your appetite.  Overeat or want to eat unhealthy foods.  Want to use drugs or alcohol. Stress can also cause physical symptoms, such as:  Sore, tense muscles, especially in the shoulders and neck.  Headaches.  Trouble breathing.  A faster heart rate.  Stomach pain, nausea, or vomiting.  Diarrhea or constipation.  Trouble concentrating. Follow these instructions at home: Lifestyle  Identify the source of your stress and your reaction to it. See a therapist who can help you change your reactions.  When there are stressful events: ? Talk about it with family, friends, or co-workers. ? Try to think realistically about stressful events and not ignore them or overreact. ? Try to find the positives in a stressful situation and not focus on the  negatives. ? Cut back on responsibilities at work and home, if possible. Ask for help from friends or family members if you need it.  Find ways to cope with stress, such as: ? Meditation. ? Deep breathing. ? Yoga or tai chi. ? Progressive muscle relaxation. ? Doing art, playing music, or reading. ? Making time for fun activities. ? Spending time with family and friends.  Get support from family, friends, or spiritual resources. Eating and drinking  Eat a healthy diet. This includes: ? Eating foods that are high in fiber, such as beans, whole grains, and fresh fruits and vegetables. ? Limiting foods that are high in fat and processed sugars, such as fried and sweet foods.  Do not skip meals or overeat.  Drink enough fluid to keep your urine pale yellow. Alcohol use  Do not drink alcohol if: ? Your health care provider tells you not to drink. ? You are pregnant, may be pregnant, or are planning to become pregnant.  Drinking alcohol is a way some people try to ease their stress. This can be dangerous, so if you drink alcohol: ? Limit how much you use to:  0-1 drink a day for women.  0-2 drinks a day for men. ? Be aware of how much alcohol is in your drink. In the [place] one drink equals one 12 oz bottle of beer (355 mL), one 5 oz glass of wine (148 mL), or one 1 oz glass of hard liquor (44 mL). Activity  Include 30 minutes of exercise in your daily schedule. Exercise is a good stress reducer.  Include time in your day for   an activity that you find relaxing. Try taking a walk, going on a bike ride, reading a book, or listening to music.  Schedule your time in a way that lowers stress, and keep a consistent schedule. Prioritize what is most important to get done.   General instructions  Get enough sleep. Try to go to sleep and get up at about the same time every day.  Take over-the-counter and prescription medicines only as told by your health care provider.  Do not use any  products that contain nicotine or tobacco, such as cigarettes, e-cigarettes, and chewing tobacco. If you need help quitting, ask your health care provider.  Do not use drugs or smoke to cope with stress.  Keep all follow-up visits as told by your health care provider. This is important. Where to find support  Talk with your health care provider about stress management or finding a support group.  Find a therapist to work with you on your stress management techniques. Contact a health care provider if:  Your stress symptoms get worse.  You are unable to manage your stress at home.  You are struggling to stop using drugs or alcohol. Get help right away if:  You may be a danger to yourself or others.  You have any thoughts of death or suicide. If you ever feel like you may hurt yourself or others, or have thoughts about taking your own life, get help right away. You can go to your nearest emergency department or call:  Your local emergency services (911 in the [place]  A suicide crisis helpline, such as the National Suicide Prevention Lifeline at 1-800-273-8255. This is open 24 hours a day. Summary  Feeling a certain amount of stress is normal, but severe or long-lasting (chronic) stress can affect your mental and physical health.  Chronic stress can put you at higher risk for anxiety, depression, and other health problems like digestive problems, muscle aches, heart disease, high blood pressure, and stroke.  You may be at higher risk for stress-related problems if you do not get enough sleep, are in poor health, lack emotional support, or have a mental health disorder like anxiety or depression.  Identify the source of your stress and your reaction to it. Try talking about stressful events with family, friends, or co-workers, finding a coping method, or getting support from spiritual resources.  If you need more help, talk with your health care provider about finding a support group  or a mental health therapist. This information is not intended to replace advice given to you by your health care provider. Make sure you discuss any questions you have with your health care provider. Document Revised: 12/10/2018 Document Reviewed: 12/10/2018 Elsevier Patient Education  2021 [employer]  

## 2020-09-01 NOTE — Progress Notes (Signed)
Patient ID: Shannon Ashley, female    DOB: 21-Mar-1966  MRN: [id number]  CC: Establish Care   Subjective: Shannon Ashley is a 55 y.o. female who presents for new pt visit Her concerns today include:  Patient with history of HL, HTN, anxiety disorder, MDD   Previous PCP was Dr. Wolfgang Phoenix with [facility].  She was with him for 25 years.  He retired last year.  Given a new provider whom she had a disagreement with on their first visit over refills on her Xanax which she refused to do.  HTN: on Cozaar and Lozol Checks BP 1-3 x a wk. BP doing better lately but was running high for several wks prior to this visit today.  She attributes this to increased stress.  She bought some supplements from the [facility] store that she was told would help with blood pressure.  She has those bottles with her today.  One is L-arginine and the other one is called Healthy BP that contains 10 mg of niacin and other supplements Just started walking 2 x wk for past 2-3 wks Limits salt to foods as much as she can.   No swelling in legs.  Occasional swelling RT ankle from history of tear tendon in the ankle. No CP.  HL:  Taking and tolerating Crestor.    Depression/Anx:  On Lexapro.  Doing okay on it.  Reports being on Xanax in the past through her previous PCP of 25 years but it was discontinued after he retired. She reports increased stress at work.  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  Reports increased workload and also going to school part-time trying to get her bachelor's degree.  She has an associates degree but is now required to have a bachelor's to maintain her job.  She went through a divorce in 2013.  She has 2 grown children in their 28s who have completed college and is now on their own. -She is up late at night on the computer working or studying.  She feels overwhelmed a lot.  Others  always look up to her to be strong and she tries to be but she feels overwhelmed.  She tries to practice some of the techniques that she tells the parents of young children who are in her program at work.  She has never done therapy.  Complains of having a lump on her left lateral thigh which has been there for about 2 years.  It is not painful but it has increased in size.  HM:  Due for MMG.  Due for COVID booster.  Had covid the beginning of the yr. due for Tdap.  Past medical, social, family history and surgical history reviewed.  Patient Active Problem List   Diagnosis Date Noted  . Encounter for screening fecal occult blood testing 01/01/2020  . Encounter for well woman exam with routine gynecological exam 01/01/2020  . Screening for colorectal cancer 07/11/2018  . Encounter for gynecological examination with Papanicolaou smear of cervix 07/11/2018  . Menopause present, declines hormone replacement therapy 07/11/2018  . Depression, major, single episode, mild ([place]) 04/23/2018  . Spotting 08/08/2016  . Pelvic cramping 08/08/2016  . Encounter for routine gynecological examination with Papanicolaou smear of cervix 08/08/2016  . Mixed stress and urge urinary incontinence 08/08/2016  . Vitamin D deficiency 02/03/2016  . History of colonic polyps   . Diverticulosis  of colon without hemorrhage   . Abdominal pain 09/30/2014  . Bowel habit changes 09/21/2014  . Rectal bleeding 09/21/2014  . Hemorrhoids 09/21/2014  . Hyperlipidemia 08/31/2014  . Anxiety 07/07/2013  . Hypertension 07/07/2013  . Elevated cholesterol 07/07/2013     Current Outpatient Medications on File Prior to Visit  Medication Sig Dispense Refill  . BIOTIN PO Take 1 tablet by mouth daily.     . cetirizine (ZYRTEC) 10 MG tablet Take 10 mg by mouth daily as needed for allergies.     Marland Kitchen escitalopram (LEXAPRO) 20 MG tablet Take 1 tablet (20 mg total) by mouth daily. 30 tablet 6  . fluticasone (FLONASE) 50 MCG/ACT nasal  spray USE TWO SPRAYS IN BOTH NOSTRILS DAILY 16 g 2  . indapamide (LOZOL) 2.5 MG tablet TAKE ONE TABLET (2.5MG  TOTAL) BY MOUTH DAILY 30 tablet 3  . losartan (COZAAR) 100 MG tablet TAKE ONE TABLET (100MG  TOTAL) BY MOUTH DAILY 30 tablet 3  . Multiple Vitamin (MULTIVITAMIN) tablet Take 1 tablet by mouth daily.    . rosuvastatin (CRESTOR) 5 MG tablet TAKE ONE TABLET (5MG  TOTAL) BY MOUTH DAILY FOR CHOLESTEROL 30 tablet 3  . estradiol (ESTRACE) 1 MG tablet Take 1 tablet (1 mg total) by mouth daily. (Patient not taking: Reported on 09/01/2020) 30 tablet 6  . progesterone (PROMETRIUM) 200 MG capsule Take 1 at bedtime (Patient not taking: Reported on 09/01/2020) 30 capsule 6   No current facility-administered medications on file prior to visit.    Allergies  Allergen Reactions  . Lisinopril Cough    Social History   Socioeconomic History  . Marital status: Divorced    Spouse name: Not on file  . Number of children: Not on file  . Years of education: Not on file  . Highest education level: Not on file  Occupational History  . Not on file  Tobacco Use  . Smoking status: Never Smoker  . Smokeless tobacco: Never Used  Vaping Use  . Vaping Use: Never used  Substance and Sexual Activity  . Alcohol use: Yes    Alcohol/week: 4.0 standard drinks    Types: 4 Glasses of wine per week    Comment: occ wine  . Drug use: No  . Sexual activity: Yes    Birth control/protection: Surgical    Comment: tubal and ablation  Other Topics Concern  . Not on file  Social History Narrative  . Not on file   Social Determinants of Health   Financial Resource Strain: Medium Risk  . Difficulty of Paying Living Expenses: Somewhat hard  Food Insecurity: No Food Insecurity  . Worried About Charity fundraiser in the Last Year: Never true  . Ran Out of Food in the Last Year: Never true  Transportation Needs: No Transportation Needs  . Lack of Transportation (Medical): No  . Lack of Transportation (Non-Medical):  No  Physical Activity: Insufficiently Active  . Days of Exercise per Week: 2 days  . Minutes of Exercise per Session: 10 min  Stress: Stress Concern Present  . Feeling of Stress : Very much  Social Connections: Moderately Isolated  . Frequency of Communication with Friends and Family: More than three times a week  . Frequency of Social Gatherings with Friends and Family: Once a week  . Attends Religious Services: More than 4 times per year  . Active Member of Clubs or Organizations: No  . Attends Archivist Meetings: Never  . Marital Status: Divorced  Human resources officer Violence:  Not At Risk  . Fear of Current or Ex-Partner: No  . Emotionally Abused: No  . Physically Abused: No  . Sexually Abused: No    Family History  Problem Relation Age of Onset  . Cancer Mother        lung  . Hypertension Mother   . Stroke Father   . Heart attack Father   . Congestive Heart Failure Father   . Kidney failure Father   . Colon polyps Father   . Hypertension Sister   . Drug abuse Sister   . Diabetes Maternal Uncle   . Early death Paternal 68   . Asthma Paternal Aunt   . Cancer Maternal Grandmother        lung  . Congestive Heart Failure Paternal Grandmother   . Cancer Paternal Grandmother        pancreatic    Past Surgical History:  Procedure Laterality Date  . COLONOSCOPY N/A 10/13/2014   Procedure: COLONOSCOPY;  Surgeon: Daneil Dolin, MD;  [facility];  Service: Endoscopy;  Laterality: N/A;  830   . COLONOSCOPY N/A 12/02/2018   Procedure: COLONOSCOPY;  Surgeon: Daneil Dolin, MD;  [facility]: [facility];  Service: Endoscopy;  Laterality: N/A;  8:30am  . ECTOPIC PREGNANCY SURGERY    . ENDOMETRIAL ABLATION    . POLYPECTOMY  12/02/2018   Procedure: POLYPECTOMY;  Surgeon: Daneil Dolin, MD;  Location: [facility];  Service: Endoscopy;;  colon  . TUBAL LIGATION      ROS: Review of Systems Negative except as stated above  PHYSICAL EXAM: BP 123/74    Pulse 72   Ht 5\' 7"  (1.702 m)   Wt 198 lb 3.2 oz (89.9 kg)   SpO2 99%   BMI 31.04 kg/m   Physical Exam  General appearance - alert, well appearing, middle-aged African-American female and in no distress Mental status -patient becomes tearful when explaining to me how stress and overwhelmed she feels. Neck - supple, no significant adenopathy Chest - clear to auscultation, no wheezes, rales or rhonchi, symmetric air entry Heart - normal rate, regular rhythm, normal S1, S2, no murmurs, rubs, clicks or gallops Extremities -no lower extremity edema Skin: Raised circular mass that is 2 x 2.5 cm in size on the upper left lateral thigh.  It is not tender to touch.  It is not hyperpigmented.  Depression screen [facility] 2/9 09/01/2020 01/01/2020 07/11/2018  Decreased Interest 1 2 1   Down, Depressed, Hopeless 2 2 1   PHQ - 2 Score 3 4 2   Altered sleeping 1 2 0  Tired, decreased energy 3 3 2   Change in appetite 2 1 0  Feeling bad or failure about yourself  1 1 0  Trouble concentrating 2 2 1   Moving slowly or fidgety/restless 0 0 0  Suicidal thoughts 0 0 0  PHQ-9 Score 12 13 5   Difficult doing work/chores - Somewhat difficult Somewhat difficult   GAD 7 : Generalized Anxiety Score 09/01/2020 01/01/2020  Nervous, Anxious, on Edge 2 1  Control/stop worrying 1 0  Worry too much - different things 0 1  Trouble relaxing 2 2  Restless 0 0  Easily annoyed or irritable 2 2  Afraid - awful might happen 0 1  Total GAD 7 Score 7 7  Anxiety Difficulty - Somewhat difficult      CMP Latest Ref Rng & Units 04/21/2018 07/06/2017 06/04/2016  Glucose 65 - 99 mg/dL - 83 86  BUN 6 - 24 mg/dL -  10 13  Creatinine 0.57 - 1.00 mg/dL - 0.88 0.87  Sodium 134 - 144 mmol/L - 140 138  Potassium 3.5 - 5.2 mmol/L - 4.3 4.4  Chloride 96 - 106 mmol/L - 104 98  CO2 20 - 29 mmol/L - 21 26  Calcium 8.7 - 10.2 mg/dL - 9.3 9.6  Total Protein 6.0 - 8.5 g/dL 6.8 7.4 7.7  Total Bilirubin 0.0 - 1.2 mg/dL <0.2 0.3 0.3  Alkaline Phos 39  - 117 IU/L 57 63 65  AST 0 - 40 IU/L 23 21 16   ALT 0 - 32 IU/L 35(H) 29 24   Lipid Panel     Component Value Date/Time   CHOL 217 (H) 04/21/2018 1110   TRIG 82 04/21/2018 1110   HDL 62 04/21/2018 1110   CHOLHDL 3.5 04/21/2018 1110   CHOLHDL 3.5 07/07/2013 0934   VLDL 11 07/07/2013 0934   LDLCALC 139 (H) 04/21/2018 1110    CBC    Component Value Date/Time   WBC 7.3 07/07/2013 0934   RBC 4.02 07/07/2013 0934   HGB 12.9 07/07/2013 0934   HCT 37.4 07/07/2013 0934   PLT 333 07/07/2013 0934   MCV 93.0 07/07/2013 0934   MCH 32.1 07/07/2013 0934   MCHC 34.5 07/07/2013 0934   RDW 13.1 07/07/2013 0934    ASSESSMENT AND PLAN: 1. Establishing care with new doctor, encounter for   2. Essential hypertension At goal.  Continue current meds and low salt diet - CBC - Comprehensive metabolic panel  3. Mixed hyperlipidemia Continue Crestor - Lipid panel  4. Major depressive disorder, single episode, mild ([place]) 5. GAD (generalized anxiety disorder) -Discussed ways to help manage stress including getting regular exercise, trying to eat healthy, getting adequate sleep at nights at least 8 hours.  Also recommend taking at least 1 hour out of her day to do something that brings her joy be listening to music or watching her favorite TV program.  Encourage deep breathing exercises if only for 3 minutes when she begins to feel overwhelmed.  Encouraged seeing a therapist.  Patient is open to that idea.  I will submit a referral to see if we can get her in with a therapist in [place]. - Ambulatory referral to Psychiatry   6. Epidermoid cyst of skin of thigh - Ambulatory referral to Dermatology  7. Need for Tdap vaccination Given today.  8. Encounter for screening mammogram for malignant neoplasm of breast - MM Digital Screening; Future    Patient was given the opportunity to ask questions.  Patient verbalized understanding of the plan and was able to repeat key elements of the plan.    Orders Placed This Encounter  Procedures  . MM Digital Screening  . Tdap vaccine greater than or equal to 7yo IM  . CBC  . Comprehensive metabolic panel  . Lipid panel  . Ambulatory referral to Psychiatry  . Ambulatory referral to Dermatology     Requested Prescriptions    No prescriptions requested or ordered in this encounter    Return in about 2 months (around 11/01/2020).  Karle Plumber, MD, FACP

## 2020-09-02 ENCOUNTER — Telehealth: Payer: Self-pay | Admitting: Internal Medicine

## 2020-09-02 ENCOUNTER — Other Ambulatory Visit: Payer: Self-pay | Admitting: Internal Medicine

## 2020-09-02 ENCOUNTER — Telehealth: Payer: Self-pay

## 2020-09-02 LAB — CBC
Hematocrit: 41 % (ref 34.0–46.6)
Hemoglobin: 13.8 g/dL (ref 11.1–15.9)
MCH: 32.1 pg (ref 26.6–33.0)
MCHC: 33.7 g/dL (ref 31.5–35.7)
MCV: 95 fL (ref 79–97)
Platelets: 318 10*3/uL (ref 150–450)
RBC: 4.3 x10E6/uL (ref 3.77–5.28)
RDW: 12.8 % (ref 11.7–15.4)
WBC: 6.2 10*3/uL (ref 3.4–10.8)

## 2020-09-02 LAB — LIPID PANEL
Chol/HDL Ratio: 4.4 ratio (ref 0.0–4.4)
Cholesterol, Total: 264 mg/dL — ABNORMAL HIGH (ref 100–199)
HDL: 60 mg/dL (ref >39)
LDL Chol Calc (NIH): 190 mg/dL — ABNORMAL HIGH (ref 0–99)
Triglycerides: 84 mg/dL (ref 0–149)
VLDL Cholesterol Cal: 14 mg/dL (ref 5–40)

## 2020-09-02 LAB — COMPREHENSIVE METABOLIC PANEL
ALT: 24 IU/L (ref 0–32)
AST: 24 IU/L (ref 0–40)
Albumin/Globulin Ratio: 1.5 (ref 1.2–2.2)
Albumin: 4.7 g/dL (ref 3.8–4.9)
Alkaline Phosphatase: 74 IU/L (ref 44–121)
BUN/Creatinine Ratio: 13 (ref 9–23)
BUN: 11 mg/dL (ref 6–24)
Bilirubin Total: 0.2 mg/dL (ref 0.0–1.2)
CO2: 26 mmol/L (ref 20–29)
Calcium: 10 mg/dL (ref 8.7–10.2)
Chloride: 99 mmol/L (ref 96–106)
Creatinine, Ser: 0.84 mg/dL (ref 0.57–1.00)
Globulin, Total: 3.1 g/dL (ref 1.5–4.5)
Glucose: 81 mg/dL (ref 65–99)
Potassium: 4.4 mmol/L (ref 3.5–5.2)
Sodium: 138 mmol/L (ref 134–144)
Total Protein: 7.8 g/dL (ref 6.0–8.5)
eGFR: 84 mL/min/{1.73_m2} (ref >59)

## 2020-09-02 MED ORDER — ROSUVASTATIN CALCIUM 10 MG PO TABS: ORAL_TABLET | ORAL | 5 refills | Status: DC

## 2020-09-02 MED ORDER — ESCITALOPRAM OXALATE 20 MG PO TABS: 20.0000 mg | ORAL_TABLET | Freq: Every day | ORAL | 5 refills | Status: DC

## 2020-09-02 MED ORDER — INDAPAMIDE 2.5 MG PO TABS: ORAL_TABLET | ORAL | 5 refills | Status: DC

## 2020-09-02 MED ORDER — LOSARTAN POTASSIUM 100 MG PO TABS: ORAL_TABLET | ORAL | 5 refills | Status: DC

## 2020-09-02 NOTE — Telephone Encounter (Signed)
Contacted pt to go over lab results pt is aware and doesn't have any questions or concerns 

## 2020-09-02 NOTE — Telephone Encounter (Signed)
Pharmacy called to get clarification on the dosage of patient's medication for rosuvastatin (CRESTOR) 10 MG tablet.  Please call pharmacy to clarify at [phone]

## 2020-09-02 NOTE — Progress Notes (Signed)
Let patient know that her blood cell counts including red blood cell, white blood cells and platelet counts are normal.  Kidney and liver function tests are normal.  LDL cholesterol level is 190 with goal being less than 100.  She told me yesterday that she has been taking the rosuvastatin 5 mg consistently every day.  I recommend increasing the dose to 10 mg daily.  Updated prescription will be sent to her pharmacy.

## 2020-09-05 NOTE — Telephone Encounter (Signed)
Copied from [place] [id number]. Topic: General - Other >> Sep 05, 2020  2:48 PM Tessa Lerner A wrote: Reason for CRM: Tammy with [facility] has made contact regarding clarification on a prescription  Patient's prescription for rosuvastatin specifies a dosage of 10 MG but the instructions for the medication reference 5MG   rosuvastatin (CRESTOR) 10 MG tablet [[id number]]   Order Details Dose, Route, Frequency: As Directed Dispense Quantity: 30 tablet Refills: 5     Sig: TAKE ONE TABLET (5MG  TOTAL) BY MOUTH DAILY FOR CHOLESTEROL  Please contact to further advise when possible.

## 2020-09-06 MED ORDER — ROSUVASTATIN CALCIUM 10 MG PO TABS: 10.0000 mg | ORAL_TABLET | Freq: Every day | ORAL | 5 refills | Status: DC

## 2020-09-06 NOTE — Telephone Encounter (Signed)
Dr. Wynetta Emery could you please change directions and resend rx

## 2020-11-04 ENCOUNTER — Ambulatory Visit: Payer: BC Managed Care – PPO | Admitting: Internal Medicine

## 2020-12-15 ENCOUNTER — Other Ambulatory Visit: Payer: Self-pay

## 2020-12-15 ENCOUNTER — Ambulatory Visit
Admission: RE | Admit: 2020-12-15 | Discharge: 2020-12-15 | Disposition: A | Discharge status: Home / Self Care | Payer: BC Managed Care – PPO | Source: Ambulatory Visit | Attending: Internal Medicine | Admitting: Internal Medicine

## 2020-12-15 DIAGNOSIS — Z1231 Encounter for screening mammogram for malignant neoplasm of breast: Secondary | ICD-10-CM

## 2020-12-20 ENCOUNTER — Telehealth: Payer: Self-pay

## 2020-12-20 NOTE — Telephone Encounter (Signed)
Contacted pt to go over MM results pt is aware and doesn't have any questions or concerns

## 2021-01-02 ENCOUNTER — Other Ambulatory Visit: Payer: Self-pay

## 2021-01-02 ENCOUNTER — Ambulatory Visit: Payer: BC Managed Care – PPO | Attending: Internal Medicine | Admitting: Internal Medicine

## 2021-01-02 DIAGNOSIS — E782 Mixed hyperlipidemia: Secondary | ICD-10-CM | POA: Diagnosis not present

## 2021-01-02 DIAGNOSIS — E669 Obesity, unspecified: Secondary | ICD-10-CM

## 2021-01-02 DIAGNOSIS — F411 Generalized anxiety disorder: Secondary | ICD-10-CM | POA: Diagnosis not present

## 2021-01-02 DIAGNOSIS — I10 Essential (primary) hypertension: Secondary | ICD-10-CM

## 2021-01-02 MED ORDER — BUSPIRONE HCL 5 MG PO TABS: 5.0000 mg | ORAL_TABLET | Freq: Two times a day (BID) | ORAL | 1 refills | Status: DC

## 2021-01-02 NOTE — Progress Notes (Signed)
Patient ID: Shannon Ashley, female   DOB: 07-Nov-1965, 55 y.o.   MRN: [id number] Virtual Visit via Telephone Note  I connected with Shannon Ashley on 01/02/2021 at 4:17 p.m by telephone and verified that I am speaking with the correct person using two identifiers  Location: Patient: home Provider: office  Participants: Myself Patient    I discussed the limitations, risks, security and privacy concerns of performing an evaluation and management service by telephone and the availability of in person appointments. I also discussed with the patient that there may be a patient responsible charge related to this service. The patient expressed understanding and agreed to proceed.   History of Present Illness: Patient with history of HL, HTN, anxiety disorder, MDD.  Last eval 08/2020.  This is for chronoc ds management.  HTN: reports over all her BP is doing good.  May fluctuate some times -checks BP 3 x a wk.  Range 130s/80s with reading last wk of 135/88. She limits salt in foods Compliant with Cozaar and Indapamide Thinks stress and anxiety playing a role.  Feeling overwhelm with her job and about to start school again.  Taking the Lexapro daily but requesting additional medication to help her relax.  Referred to psychiatry on last visit for some counseling.  However patient states she never heard from it.  HL/obesity: feels she has gain a few lbs.  Over eats when stressed.  Trying to do better and get back on track. She parks further away from buildings when going places.  She goes over to a friends apartment who has a gym in the building.  Exercise on the elliptical but she is not consistent in doing this. -compliant with Crestor.  LDL was 190.  The dose of the Crestor 10 daily.  Outpatient Encounter Medications as of 01/02/2021  Medication Sig   BIOTIN PO Take 1 tablet by mouth daily.    cetirizine (ZYRTEC) 10 MG tablet Take 10 mg by mouth daily as needed for allergies.    escitalopram  (LEXAPRO) 20 MG tablet Take 1 tablet (20 mg total) by mouth daily.   estradiol (ESTRACE) 1 MG tablet Take 1 tablet (1 mg total) by mouth daily. (Patient not taking: Reported on 09/01/2020)   fluticasone (FLONASE) 50 MCG/ACT nasal spray USE TWO SPRAYS IN BOTH NOSTRILS DAILY   indapamide (LOZOL) 2.5 MG tablet TAKE ONE TABLET (2.5MG TOTAL) BY MOUTH DAILY   losartan (COZAAR) 100 MG tablet TAKE ONE TABLET (100MG TOTAL) BY MOUTH DAILY   Multiple Vitamin (MULTIVITAMIN) tablet Take 1 tablet by mouth daily.   progesterone (PROMETRIUM) 200 MG capsule Take 1 at bedtime (Patient not taking: Reported on 09/01/2020)   rosuvastatin (CRESTOR) 10 MG tablet Take 1 tablet (10 mg total) by mouth daily.   No facility-administered encounter medications on file as of 01/02/2021.      Observations/Objective: Results for orders placed or performed in visit on 09/01/20  CBC  Result Value Ref Range   WBC 6.2 3.4 - 10.8 x10E3/uL   RBC 4.30 3.77 - 5.28 x10E6/uL   Hemoglobin 13.8 11.1 - 15.9 g/dL   Hematocrit 41.0 34.0 - 46.6 %   MCV 95 79 - 97 fL   MCH 32.1 26.6 - 33.0 pg   MCHC 33.7 31.5 - 35.7 g/dL   RDW 12.8 11.7 - 15.4 %   Platelets 318 150 - 450 x10E3/uL  Comprehensive metabolic panel  Result Value Ref Range   Glucose 81 65 - 99 mg/dL   BUN 11  6 - 24 mg/dL   Creatinine, Ser 0.84 0.57 - 1.00 mg/dL   eGFR 84 >59 mL/min/1.73   BUN/Creatinine Ratio 13 9 - 23   Sodium 138 134 - 144 mmol/L   Potassium 4.4 3.5 - 5.2 mmol/L   Chloride 99 96 - 106 mmol/L   CO2 26 20 - 29 mmol/L   Calcium 10.0 8.7 - 10.2 mg/dL   Total Protein 7.8 6.0 - 8.5 g/dL   Albumin 4.7 3.8 - 4.9 g/dL   Globulin, Total 3.1 1.5 - 4.5 g/dL   Albumin/Globulin Ratio 1.5 1.2 - 2.2   Bilirubin Total 0.2 0.0 - 1.2 mg/dL   Alkaline Phosphatase 74 44 - 121 IU/L   AST 24 0 - 40 IU/L   ALT 24 0 - 32 IU/L  Lipid panel  Result Value Ref Range   Cholesterol, Total 264 (H) 100 - 199 mg/dL   Triglycerides 84 0 - 149 mg/dL   HDL 60 >39 mg/dL   VLDL  Cholesterol Cal 14 5 - 40 mg/dL   LDL Chol Calc (NIH) 190 (H) 0 - 99 mg/dL   Chol/HDL Ratio 4.4 0.0 - 4.4 ratio     Assessment and Plan: 1. Essential hypertension Blood pressure readings are not at goal.  Goal is 130/80 or lower.  We discussed adding another medication.  Patient wants to hold off on that for now and would prefer to wait until next visit.  If blood pressure still not at goal at that time she will consider adding another medication.  2. Mixed hyperlipidemia We will plan to recheck lipid profile on next in person visit.  Continue Crestor.  3. GAD (generalized anxiety disorder) Discussed ways to help reduce stress including exercise, listening to music and talking with friends.  I will resubmit the referral to behavioral health to try to get her in for some counseling.  Message sent to our LCSW to assist with this.  She is agreeable to trying low-dose of BuSpar.  She will continue the Lexapro. - Ambulatory referral to Psychiatry - busPIRone (BUSPAR) 5 MG tablet; Take 1 tablet (5 mg total) by mouth 2 (two) times daily.  Dispense: 60 tablet; Refill: 1  4. Obesity (BMI 30-39.9) Discussed and encourage healthy eating habits. Encouraged her to try to get in some exercise is only 3 days a week for 30 minutes.  I told her that it will help with stress management will also help her sleep better.   Follow Up Instructions: 2 mths   I discussed the assessment and treatment plan with the patient. The patient was provided an opportunity to ask questions and all were answered. The patient agreed with the plan and demonstrated an understanding of the instructions.   The patient was advised to call back or seek an in-person evaluation if the symptoms worsen or if the condition fails to improve as anticipated.  I  Spent 20 minutes on this telephone encounter  Karle Plumber, MD

## 2021-01-06 ENCOUNTER — Telehealth: Payer: Self-pay | Admitting: Clinical

## 2021-01-09 NOTE — Telephone Encounter (Signed)
Second attempt to reach pt, no answer, unable to leave vm.

## 2021-01-17 NOTE — Telephone Encounter (Signed)
Spoke with pt and scheduled virtual appt for 02/01/21 at 10:00am

## 2021-02-01 ENCOUNTER — Other Ambulatory Visit: Payer: Self-pay

## 2021-02-01 ENCOUNTER — Ambulatory Visit: Payer: BC Managed Care – PPO | Attending: Internal Medicine | Admitting: Clinical

## 2021-02-01 DIAGNOSIS — F4323 Adjustment disorder with mixed anxiety and depressed mood: Secondary | ICD-10-CM | POA: Diagnosis not present

## 2021-02-02 NOTE — BH Specialist Note (Signed)
Integrated Behavioral Health via Telemedicine Visit  02/01/2021 Shannon Ashley [id number]  Number of [place] visits: 1/6 Session Start time: 10:10am  Session End time: 10:55am Total time: 45   Referring Provider: Karle Plumber, MD Patient/Family location: Home [facility] Provider location: [facility] office All persons participating in visit: Pt and LCSWA Types of Service: Individual psychotherapy and Video visit  I connected with Shannon Ashley via [employer]  (Video is Tree surgeon) and verified that I am speaking with the correct person using two identifiers. Discussed confidentiality: Yes   I discussed the limitations of telemedicine and the availability of in person appointments.  Discussed there is a possibility of technology failure and discussed alternative modes of communication if that failure occurs.  I discussed that engaging in this telemedicine visit, they consent to the provision of behavioral healthcare and the services will be billed under their insurance.  Patient and/or legal guardian expressed understanding and consented to Telemedicine visit: Yes   Presenting Concerns: Patient and/or family reports the following symptoms/concerns: Pt reports feeling depressed at times, decreased energy, difficulty concentrating, anxiousness, restlessness, and excessive worrying. Reports that she works full time and is in school part-time and is often overwhelmed. Reports that she begins to experience difficulty sleeping and trouble concentrating when she is overwhelmed with school and work. Reports that she is learning to say no to decrease feeling overwhelmed but has difficulty saying no to her children.  Duration of problem: 1 year; Severity of problem: moderate  Patient and/or Family's Strengths/Protective Factors: Social and Emotional competence and Concrete supports in place (healthy food, safe environments, etc.)  Goals  Addressed: Patient will:  Reduce symptoms of: anxiety, depression, and stress   Increase knowledge and/or ability of: coping skills and stress reduction   Demonstrate ability to: Increase healthy adjustment to current life circumstances  Progress towards Goals: Ongoing  Interventions: Interventions utilized:  Mindfulness or Psychologist, educational, CBT Cognitive Behavioral Therapy, Supportive Counseling, and Psychoeducation and/or Health Education Standardized Assessments completed: GAD-7 and PHQ 9  Patient and/or Family Response: Pt receptive to tx. Pt receptive to psychoeducation on depression and anxiety. Pt receptive to cognitive restructuring in order to decrease pt worries. Pt receptive to affirmations provided on pt's emotional awareness. Pt will begin utilizing deep breathing exercises, meditation, and incorporating self-care time/days.   Assessment: Denies SI/HI. Denies auditory/visual hallucinations. Patient currently experiencing anxiety and depression as an adjustment reaction to stress. Pt appears overwhelmed between both school and work. Pt has limited self-care and experiences trouble concentrating and difficulty sleeping as a result. Pt also appears to have emotional awareness and has already established boundaries with family however has difficulty establishing boundaries with children.   Patient may benefit from brief therapy and incorporating daily self-care. Pt would also benefit from deep breathing exercises and meditation. Pt identified exercising as a coping skill when she has time. LCSWA encouraged pt to be intentional about self-care so that she has time for it. LCSWA provided psychoeducation on anxiety, depression, and stress. LCSWA encouraged pt to utilize deep breathing exercises and establish boundaries with children. LCSWA will fu with pt.  Plan: Follow up with behavioral health clinician on : 02/22/21 Behavioral recommendations: Utilize deep breathing exercises,  meditation, and incorporate daily self-care time and self-care days, begin establishing boundaries with children Referral(s): [place] (In Clinic)  I discussed the assessment and treatment plan with the patient and/or parent/guardian. They were provided an opportunity to ask questions and all were answered. They  agreed with the plan and demonstrated an understanding of the instructions.   They were advised to call back or seek an in-person evaluation if the symptoms worsen or if the condition fails to improve as anticipated.  Kenson Groh C Preslee Regas, LCSW

## 2021-02-22 ENCOUNTER — Ambulatory Visit: Payer: BC Managed Care – PPO | Attending: Family Medicine | Admitting: Clinical

## 2021-02-22 ENCOUNTER — Other Ambulatory Visit: Payer: Self-pay

## 2021-02-22 DIAGNOSIS — F4323 Adjustment disorder with mixed anxiety and depressed mood: Secondary | ICD-10-CM

## 2021-02-27 NOTE — BH Specialist Note (Signed)
Integrated Behavioral Health via Telemedicine Visit  02/22/2021 Shannon Ashley [id number]  Number of [place] visits: 2/6 Session Start time: 11:15am  Session End time: 11:45am Total time: 30  Referring Provider: Karle Plumber, MD Patient/Family location: Home [facility] Provider location: [facility] Office  All persons participating in visit: Pt and LCSWA Types of Service: Individual psychotherapy and Video visit  I connected with Shannon Ashley via [employer]  (Video is Tree surgeon) and verified that I am speaking with the correct person using two identifiers. Discussed confidentiality: Yes   I discussed the limitations of telemedicine and the availability of in person appointments.  Discussed there is a possibility of technology failure and discussed alternative modes of communication if that failure occurs.  I discussed that engaging in this telemedicine visit, they consent to the provision of behavioral healthcare and the services will be billed under their insurance.  Patient and/or legal guardian expressed understanding and consented to Telemedicine visit: Yes   Presenting Concerns: Patient and/or family reports the following symptoms/concerns: Pt reports feeling anxious, decreased energy, difficulty concentrating, worrying, and feels depressed at times. Reports that she is in school part-time and is often overwhelmed. Reports that she recently was sick and has been trying to catch up on work. Reports that she is continuing to establish boundaries with family.  Duration of problem: 1 year; Severity of problem: moderate  Patient and/or Family's Strengths/Protective Factors: Social and Emotional competence and Concrete supports in place (healthy food, safe environments, etc.)  Goals Addressed: Patient will:  Reduce symptoms of: anxiety, depression, and stress   Increase knowledge and/or ability of: coping skills and stress  reduction   Demonstrate ability to: Increase healthy adjustment to current life circumstances  Progress towards Goals: Ongoing  Interventions: Interventions utilized:  Mindfulness or Psychologist, educational, CBT Cognitive Behavioral Therapy, Supportive Counseling, and Psychoeducation and/or Health Education Standardized Assessments completed: Not Needed  Patient and/or Family Response: Pt receptive to tx. Pt receptive to psychoeducation provided on stress. Pt receptive to cognitive restructuring utilized to decrease pt worries. Pt receptive to continuing deep breathing exercises and incorporating self-care time/days.  Assessment: Denies SI/HI. Denies auditory/visual hallucinations. No safety risks. Patient currently experiencing anxiety and depression as an adjustment reaction to stress. Pt continues to feel overwhelmed with school and work. Pt has been incorporating self-care as a result of getting sick and realizing that she needed rest. Pt continues to experience worries with school. Pt continues to establish boundaries with family.   Patient may benefit from continued brief therapy and continuing to incorporate self-care. LCSWA provided psychcoeducation on stress and its association with physical health. LCSWA utilized cognitive restructuring to decrease pt worries. LCSWA encouraged pt to continue incorporating self-care and deep breathing exercises. LCSWA will fu with pt.  Plan: Follow up with behavioral health clinician on : 03/15/21 Behavioral recommendations: Continue establishing boundaries with family, utilize deep breathing exercises, and continue incorporating self-care time/days. Referral(s): [place] (In Clinic)  I discussed the assessment and treatment plan with the patient and/or parent/guardian. They were provided an opportunity to ask questions and all were answered. They agreed with the plan and demonstrated an understanding of the instructions.    They were advised to call back or seek an in-person evaluation if the symptoms worsen or if the condition fails to improve as anticipated.  Harvest Stanco C Jorene Kaylor, LCSW

## 2021-03-06 ENCOUNTER — Ambulatory Visit: Payer: BC Managed Care – PPO | Attending: Internal Medicine | Admitting: Internal Medicine

## 2021-03-06 ENCOUNTER — Other Ambulatory Visit: Payer: Self-pay

## 2021-03-06 VITALS — BP 163/97 | HR 69 | Resp 16 | Wt 210.4 lb

## 2021-03-06 DIAGNOSIS — E669 Obesity, unspecified: Secondary | ICD-10-CM | POA: Diagnosis not present

## 2021-03-06 DIAGNOSIS — R5383 Other fatigue: Secondary | ICD-10-CM

## 2021-03-06 DIAGNOSIS — E782 Mixed hyperlipidemia: Secondary | ICD-10-CM

## 2021-03-06 DIAGNOSIS — I10 Essential (primary) hypertension: Secondary | ICD-10-CM | POA: Diagnosis not present

## 2021-03-06 DIAGNOSIS — F411 Generalized anxiety disorder: Secondary | ICD-10-CM | POA: Diagnosis not present

## 2021-03-06 MED ORDER — AMLODIPINE BESYLATE 5 MG PO TABS: 5.0000 mg | ORAL_TABLET | Freq: Every day | ORAL | 4 refills | Status: DC

## 2021-03-06 NOTE — Progress Notes (Signed)
Patient ID: Shannon Ashley, female    DOB: 07-22-1965  MRN: [id number]  CC: Hypertension   Subjective: Shannon Ashley is a 55 y.o. female who presents for chronic ds management Her concerns today include:  Patient with history of HL, HTN, anxiety disorder, MDD.   HTN: checks BP regularly and reports some # are 135/83; highest 140/89.  This a.m was 142/90 Tries to be consistent with taking meds.  Occasionally skips a day unintentionally. -limits salt in her food -Would like to exercise more but when she gets too busy with work and going to school, exercises sacrificed.  Reports decrease energy.  She is working full-time and going to school part-time to complete a bachelor's degree.  She has 1 more semester to go.  She tries to get in at least 7 hours of sleep at night.  She avoids staying up past midnight to study. Feels she would do better if she can get in more activity She has gained 12 pounds since April of this year.  Admits to stress eating.     HL: taking and tolerating Crestor.  We will plan to recheck lipid profile today.    Anx/MDD: Feels depression better.  Anxiety comes and goes depending on what she has to get done.  She has seen our LCSW twice and found the sessions helpful.  She continues the Lexapro and BuSpar.  Patient Active Problem List   Diagnosis Date Noted   GAD (generalized anxiety disorder) 09/01/2020   Encounter for screening fecal occult blood testing 01/01/2020   Encounter for well woman exam with routine gynecological exam 01/01/2020   Screening for colorectal cancer 07/11/2018   Encounter for gynecological examination with Papanicolaou smear of cervix 07/11/2018   Menopause present, declines hormone replacement therapy 07/11/2018   Major depressive disorder, single episode, mild ([place]) 04/23/2018   Spotting 08/08/2016   Pelvic cramping 08/08/2016   Encounter for routine gynecological examination with Papanicolaou smear of cervix 08/08/2016   Mixed  stress and urge urinary incontinence 08/08/2016   Vitamin D deficiency 02/03/2016   History of colonic polyps    Diverticulosis of colon without hemorrhage    Abdominal pain 09/30/2014   Bowel habit changes 09/21/2014   Rectal bleeding 09/21/2014   Hemorrhoids 09/21/2014   Hyperlipidemia 08/31/2014   Anxiety 07/07/2013   Hypertension 07/07/2013   Elevated cholesterol 07/07/2013     Current Outpatient Medications on File Prior to Visit  Medication Sig Dispense Refill   BIOTIN PO Take 1 tablet by mouth daily.      busPIRone (BUSPAR) 5 MG tablet Take 1 tablet (5 mg total) by mouth 2 (two) times daily. 60 tablet 1   cetirizine (ZYRTEC) 10 MG tablet Take 10 mg by mouth daily as needed for allergies.      escitalopram (LEXAPRO) 20 MG tablet Take 1 tablet (20 mg total) by mouth daily. 30 tablet 5   fluticasone (FLONASE) 50 MCG/ACT nasal spray USE TWO SPRAYS IN BOTH NOSTRILS DAILY 16 g 2   indapamide (LOZOL) 2.5 MG tablet TAKE ONE TABLET (2.5MG  TOTAL) BY MOUTH DAILY 30 tablet 5   losartan (COZAAR) 100 MG tablet TAKE ONE TABLET (100MG  TOTAL) BY MOUTH DAILY 30 tablet 5   Multiple Vitamin (MULTIVITAMIN) tablet Take 1 tablet by mouth daily.     rosuvastatin (CRESTOR) 10 MG tablet Take 1 tablet (10 mg total) by mouth daily. 30 tablet 5   No current facility-administered medications on file prior to visit.    Allergies  Allergen Reactions   Lisinopril Cough    Social History   Socioeconomic History   Marital status: Divorced    Spouse name: Not on file   Number of children: Not on file   Years of education: Not on file   Highest education level: Not on file  Occupational History   Not on file  Tobacco Use   Smoking status: Never   Smokeless tobacco: Never  Vaping Use   Vaping Use: Never used  Substance and Sexual Activity   Alcohol use: Yes    Alcohol/week: 4.0 standard drinks    Types: 4 Glasses of wine per week    Comment: occ wine   Drug use: No   Sexual activity: Yes     Birth control/protection: Surgical    Comment: tubal and ablation  Other Topics Concern   Not on file  Social History Narrative   Not on file   Social Determinants of Health   Financial Resource Strain: Not on file  Food Insecurity: Not on file  Transportation Needs: Not on file  Physical Activity: Not on file  Stress: Not on file  Social Connections: Not on file  Intimate Partner Violence: Not on file    Family History  Problem Relation Age of Onset   Cancer Mother        lung   Hypertension Mother    Stroke Father    Heart attack Father    Congestive Heart Failure Father    Kidney failure Father    Colon polyps Father    Hypertension Sister    Drug abuse Sister    Diabetes Maternal Uncle    Early death Paternal Aunt    Asthma Paternal Aunt    Cancer Maternal Grandmother        lung   Congestive Heart Failure Paternal Grandmother    Cancer Paternal Grandmother        pancreatic    Past Surgical History:  Procedure Laterality Date   COLONOSCOPY N/A 10/13/2014   Procedure: COLONOSCOPY;  Surgeon: Daneil Dolin, MD;  [facility];  Service: Endoscopy;  Laterality: N/A;  830    COLONOSCOPY N/A 12/02/2018   Procedure: COLONOSCOPY;  Surgeon: Daneil Dolin, MD;  Location: [facility];  Service: Endoscopy;  Laterality: N/A;  8:30am   ECTOPIC PREGNANCY SURGERY     ENDOMETRIAL ABLATION     POLYPECTOMY  12/02/2018   Procedure: POLYPECTOMY;  Surgeon: Daneil Dolin, MD;  Location: [facility];  Service: Endoscopy;;  colon   TUBAL LIGATION      ROS: Review of Systems Negative except as stated above  PHYSICAL EXAM: BP (!) 163/97   Pulse 69   Resp 16   Wt 210 lb 6.4 oz (95.4 kg)   SpO2 97%   BMI 32.95 kg/m   Wt Readings from Last 3 Encounters:  03/06/21 210 lb 6.4 oz (95.4 kg)  09/01/20 198 lb 3.2 oz (89.9 kg)  01/01/20 197 lb (89.4 kg)  Repeat BP 163/110  Physical Exam  General appearance - alert, well appearing, middle-aged African-American  female and in no distress Mental status - normal mood, behavior, speech, dress, motor activity, and thought processes Neck - supple, no significant adenopathy Chest - clear to auscultation, no wheezes, rales or rhonchi, symmetric air entry Heart - normal rate, regular rhythm, normal S1, S2, no murmurs, rubs, clicks or gallops Extremities - peripheral pulses normal, no pedal edema, no clubbing or cyanosis   CMP Latest Ref  Rng & Units 09/01/2020 04/21/2018 07/06/2017  Glucose 65 - 99 mg/dL 81 - 83  BUN 6 - 24 mg/dL 11 - 10  Creatinine 0.57 - 1.00 mg/dL 0.84 - 0.88  Sodium 134 - 144 mmol/L 138 - 140  Potassium 3.5 - 5.2 mmol/L 4.4 - 4.3  Chloride 96 - 106 mmol/L 99 - 104  CO2 20 - 29 mmol/L 26 - 21  Calcium 8.7 - 10.2 mg/dL 10.0 - 9.3  Total Protein 6.0 - 8.5 g/dL 7.8 6.8 7.4  Total Bilirubin 0.0 - 1.2 mg/dL 0.2 <0.2 0.3  Alkaline Phos 44 - 121 IU/L 74 57 63  AST 0 - 40 IU/L 24 23 21   ALT 0 - 32 IU/L 24 35(H) 29   Lipid Panel     Component Value Date/Time   CHOL 264 (H) 09/01/2020 1500   TRIG 84 09/01/2020 1500   HDL 60 09/01/2020 1500   CHOLHDL 4.4 09/01/2020 1500   CHOLHDL 3.5 07/07/2013 0934   VLDL 11 07/07/2013 0934   LDLCALC 190 (H) 09/01/2020 1500    CBC    Component Value Date/Time   WBC 6.2 09/01/2020 1500   WBC 7.3 07/07/2013 0934   RBC 4.30 09/01/2020 1500   RBC 4.02 07/07/2013 0934   HGB 13.8 09/01/2020 1500   HCT 41.0 09/01/2020 1500   PLT 318 09/01/2020 1500   MCV 95 09/01/2020 1500   MCH 32.1 09/01/2020 1500   MCH 32.1 07/07/2013 0934   MCHC 33.7 09/01/2020 1500   MCHC 34.5 07/07/2013 0934   RDW 12.8 09/01/2020 1500    ASSESSMENT AND PLAN: 1. Essential hypertension Not at goal.  Continue Cozaar and Lozol.  Add amlodipine 5 mg daily.  Continue DASH diet. - amLODipine (NORVASC) 5 MG tablet; Take 1 tablet (5 mg total) by mouth daily.  Dispense: 30 tablet; Refill: 4  2. Mixed hyperlipidemia Continue Crestor. - Lipid panel; Future  3. Obesity (BMI  30-39.9) Dietary counseling given.  Encouraged her to exercise as much as she can even if she is only able to get in 10 minutes every day.  Benefits of exercise is that it helps with mental clarity and also helps 1 sleep better at night.  4. Fatigue, unspecified type Likely due to her hectic schedule.  Advised to make sure that she gets in 7 to 8 hours of sleep at nights and try to get in regular exercise is only for 10 minutes every day.  5. GAD (generalized anxiety disorder) Continue follow-up with LCSW for therapy sessions.  Continue Lexapro and BuSpar.    Patient was given the opportunity to ask questions.  Patient verbalized understanding of the plan and was able to repeat key elements of the plan.   Orders Placed This Encounter  Procedures   Lipid panel     Requested Prescriptions   Signed Prescriptions Disp Refills   amLODipine (NORVASC) 5 MG tablet 30 tablet 4    Sig: Take 1 tablet (5 mg total) by mouth daily.    Return in about 4 months (around 07/07/2021) for Give appt with [facility] in 2 wks for BP check.  Karle Plumber, MD, FACP

## 2021-03-13 ENCOUNTER — Ambulatory Visit: Payer: BC Managed Care – PPO | Admitting: Dermatology

## 2021-03-13 ENCOUNTER — Other Ambulatory Visit: Payer: Self-pay

## 2021-03-13 DIAGNOSIS — L309 Dermatitis, unspecified: Secondary | ICD-10-CM

## 2021-03-13 DIAGNOSIS — L209 Atopic dermatitis, unspecified: Secondary | ICD-10-CM

## 2021-03-13 DIAGNOSIS — L720 Epidermal cyst: Secondary | ICD-10-CM | POA: Diagnosis not present

## 2021-03-13 MED ORDER — MOMETASONE FUROATE 0.1 % EX CREA: 1.0000 "application " | TOPICAL_CREAM | Freq: Every day | CUTANEOUS | 1 refills | Status: DC | PRN

## 2021-03-13 NOTE — Progress Notes (Signed)
   New Patient Visit  Subjective  Shannon Ashley is a 55 y.o. female who presents for the following: Cyst (Of left thigh - started out as a small bump) and Rash (Dryness of hands - uses moisturizer).  Referred by Karle Plumber, MD  The following portions of the chart were reviewed this encounter and updated as appropriate:   Tobacco  Allergies  Meds  Problems  Med Hx  Surg Hx  Fam Hx     Review of Systems:  No other skin or systemic complaints except as noted in HPI or Assessment and Plan.  Objective  Well appearing patient in no apparent distress; mood and affect are within normal limits.  A focused examination was performed including left leg, hands. Relevant physical exam findings are noted in the Assessment and Plan.  Dorsum hands Pinkness and scale  Left post lat thigh 2.2 cm Subcutaneous nodule.    Assessment & Plan  Hand dermatitis Dorsum hands  Atopic dermatitis (eczema) is a chronic, relapsing, pruritic condition that can significantly affect quality of life. It is often associated with allergic rhinitis and/or asthma and can require treatment with topical medications, phototherapy, or in severe cases a biologic medication called Dupixent in children and adults.   Start mometasone cream bid - may consider Opzelura or Eucrisa on follow up  mometasone (ELOCON) 0.1 % cream - Dorsum hands Apply 1 application topically daily as needed (Rash).  Epidermal inclusion cyst Left post lat thigh Recommend excision. Patient will schedule.  Return for Surgery cyst of left thigh.  I, Ashok Cordia, CMA, am acting as scribe for Sarina Ser, MD . Documentation: I have reviewed the above documentation for accuracy and completeness, and I agree with the above.  Sarina Ser, MD

## 2021-03-13 NOTE — Patient Instructions (Signed)
Shannon Ashley [address] [place] [address]   You have an upcoming surgery appointment scheduled at [facility]. @FUTURESURGAPPT @  PRE-OPERATIVE INSTRUCTIONS  We recommend you read the following instructions. If you have any questions or concerns, please call the office at [phone].  Shower and wash the entire body with soap and water the day of your surgery paying special attention to cleansing at and around the planned surgery site. Avoid aspirin or aspirin containing products at least ten (10) days prior to your surgical procedure and for at least one week (7 days) after your surgical procedure. If you take aspirin on a regular basis for heart disease or history of stroke or for any other reason, we may recommend you continue taking aspirin but please notify us if you take this on a regular basis. Aspirin can cause more bleeding to occur during surgery as well as prolonged bleeding and bruising after surgery. Avoid other nonsteroidal pain medications at least one week prior to surgery and at least one week after your surgery. These include medications such as Ibuprofen (Motrin, Advil and Nuprin), Naprosyn, Voltaren, Relafen, etc. If these medications are used for therapeutic reasons, please inform us as they can cause increased bleeding or prolonged bleeding during and bruising after surgical procedures.  Please advise [place] if you are taking any "blood thinner" medications such as Coumadin or Dipyridamole or Plavix or similar medications. These cause increased bleeding and prolonged bleeding during procedures and bruising after surgical procedures. We may have to consider discontinuing these medications briefly prior to and shortly after your surgery if safe to do so. Please inform us of all medications you are currently taking. All medications that are taken regularly should be taken the day of surgery as you always do. Nevertheless, we need to be informed of what  medications you are taking prior to surgery to know whether they will affect the procedure or cause any complications. Please inform us of any medication allergies. Also inform us of whether you have allergies to Latex or rubber products or whether you have had any adverse reaction to Lidocaine or Epinephrine. Please inform us of any prosthetic or artificial body parts such as artificial heart valve, joint replacements, etc., or similar condition that might require preoperative antibiotics. We recommend avoidance of alcohol at least two weeks prior to surgery and continued avoidance for at least two weeks after surgery. We recommend avoidance of tobacco smoking at least two weeks prior to surgery and continued abstinence for at least two weeks after surgery. Do not plan strenuous exercise, strenuous work or strenuous lifting for approximately four weeks after your surgery. We request if you are unable to make your scheduled surgical appointment, please call us at least a week in advance or as soon as you are aware of a problem so that we can cancel or reschedule the appointment. You MAY TAKE TYLENOL (acetaminophen) for pain as it is not a blood thinner. PLEASE PLAN TO BE IN TOWN FOR TWO WEEKS FOLLOWING SURGERY. THIS IS IMPORTANT SO YOU CAN BE CHECKED FOR DRESSING CHANGES, SUTURE REMOVAL AND TO MONITOR FOR POSSIBLE COMPLICATIONS. If you have any questions or concerns for your doctor, please call our main line at [phone] and press option 4 to reach your doctor's medical assistant. If no one answers, please leave a voicemail as directed and we will return your call as soon as possible. Messages left after 4 pm will be answered the following business day.   You may also  send [place] a message via MyChart. We typically respond to MyChart messages within 1-2 business days.  For prescription refills, please ask your pharmacy to contact our office. Our fax number is [phone].  If you have an urgent issue  when the clinic is closed that cannot wait until the next business day, you can page your doctor at the number below.    Please note that while we do our best to be available for urgent issues outside of office hours, we are not available 24/7.   If you have an urgent issue and are unable to reach [place], you may choose to seek medical care at your doctor's office, retail clinic, urgent care center, or emergency room.  If you have a medical emergency, please immediately call 911 or go to the emergency department.  Pager Numbers  - Dr. Nehemiah Massed: [phone]  - Dr. Laurence Ferrari: [phone]  - Dr. Nicole Kindred: [phone]  In the event of inclement weather, please call our main line at [phone] for an update on the status of any delays or closures.  Dermatology Medication Tips: Please keep the boxes that topical medications come in in order to help keep track of the instructions about where and how to use these. Pharmacies typically print the medication instructions only on the boxes and not directly on the medication tubes.   If your medication is too expensive, please contact our office at [phone] option 4 or send [place] a message through [place].   We are unable to tell what your co-pay for medications will be in advance as this is different depending on your insurance coverage. However, we may be able to find a substitute medication at lower cost or fill out paperwork to get insurance to cover a needed medication.   If a prior authorization is required to get your medication covered by your insurance company, please allow [place] 1-2 business days to complete this process.  Drug prices often vary depending on where the prescription is filled and some pharmacies may offer cheaper prices.  The website www.goodrx.com contains coupons for medications through different pharmacies. The prices here do not account for what the cost may be with help from insurance (it may be cheaper with your insurance),  but the website can give you the price if you did not use any insurance.  - You can print the associated coupon and take it with your prescription to the pharmacy.  - You may also stop by our office during regular business hours and pick up a GoodRx coupon card.  - If you need your prescription sent electronically to a different pharmacy, notify our office through [facility] or by phone at [phone] option 4.

## 2021-03-14 ENCOUNTER — Encounter: Payer: Self-pay | Admitting: Dermatology

## 2021-03-15 ENCOUNTER — Ambulatory Visit: Payer: BC Managed Care – PPO | Attending: Internal Medicine | Admitting: Clinical

## 2021-03-15 ENCOUNTER — Other Ambulatory Visit: Payer: Self-pay

## 2021-03-15 DIAGNOSIS — F4323 Adjustment disorder with mixed anxiety and depressed mood: Secondary | ICD-10-CM | POA: Diagnosis not present

## 2021-03-20 ENCOUNTER — Ambulatory Visit: Payer: BC Managed Care – PPO | Admitting: Pharmacist

## 2021-03-23 NOTE — BH Specialist Note (Signed)
Integrated Behavioral Health via Telemedicine Visit  03/23/2021 Shannon Ashley [id number]  Number of [place] visits: 3/6 Session Start time: 11:45am  Session End time: 12:20pm Total time: 35   Referring Provider: Karle Plumber, MD Patient/Family location: Home [facility] Provider location: [facility] Office All persons participating in visit: Pt and LCSWA Types of Service: Individual psychotherapy and Video visit  I connected with Shannon Ashley via [employer]  (Video is Tree surgeon) and verified that I am speaking with the correct person using two identifiers. Discussed confidentiality: Yes   I discussed the limitations of telemedicine and the availability of in person appointments.  Discussed there is a possibility of technology failure and discussed alternative modes of communication if that failure occurs.  I discussed that engaging in this telemedicine visit, they consent to the provision of behavioral healthcare and the services will be billed under their insurance.  Patient and/or legal guardian expressed understanding and consented to Telemedicine visit: Yes   Presenting Concerns: Patient and/or family reports the following symptoms/concerns: Reports feeling anxious, worrying, trouble relaxing, irritability, feeling depressed, decreased interest in activities, trouble sleeping, decreased energy, and trouble concentrating. Reports that she continues to feel overwhelmed with work and school. Reports that she is worried that she is going to lose her job. Reports that she is having difficulty with organization.  Duration of problem: 1 year; Severity of problem: moderate  Patient and/or Family's Strengths/Protective Factors: Social and Emotional competence and Concrete supports in place (healthy food, safe environments, etc.)  Goals Addressed: Patient will:  Reduce symptoms of: anxiety, depression, and stress   Increase  knowledge and/or ability of: coping skills and stress reduction   Demonstrate ability to: Increase healthy adjustment to current life circumstances  Progress towards Goals: Ongoing  Interventions: Interventions utilized:  Mindfulness or Psychologist, educational, CBT Cognitive Behavioral Therapy, Supportive Counseling, and Psychoeducation and/or Health Education Standardized Assessments completed: GAD-7 and PHQ 9  Patient and/or Family Response: Pt receptive to tx. Pt receptive to psychoeducation provided on stress. Pt receptive to cognitive restructuring utilized to decrease pt's negative thoughts and assist with pt identifying strengths. Pt receptive to affirmations. Pt receptive to problem solving skills provided on organization. Pt receptive to continuing deep breathing exercises and incorporating daily self-care.   Assessment: Denies SI/HI. Denies auditory/visual hallucinations. No safety risks. Patient currently experiencing anxiety and depression as a result of stress at work and school. Pt appears to have difficulty with self-esteem and feeling confident in her work. Pt has difficulty with organization skills in order to decrease stress. Pt continues to incorporate daily self-care.   Patient may benefit from continued brief therapy. LCSWA provided psychoeducation on stress and anxiety. LCSWA utilized cognitive restructuring to decrease negative thoughts and assist with identifying strengths. LCSWA assisted pt with organization skills and encouraged her to continue deep breathing exercises and daily self-care.  Plan: Follow up with behavioral health clinician on : 04/05/21 Behavioral recommendations: Incorporate organization skills, utilize deep breathing exercises, and incorporate daily self-care Referral(s): [place] (In Clinic)  I discussed the assessment and treatment plan with the patient and/or parent/guardian. They were provided an opportunity to ask  questions and all were answered. They agreed with the plan and demonstrated an understanding of the instructions.   They were advised to call back or seek an in-person evaluation if the symptoms worsen or if the condition fails to improve as anticipated.  Shannon Geil C Ryden Wainer, LCSW

## 2021-03-24 ENCOUNTER — Telehealth: Payer: Self-pay | Admitting: Internal Medicine

## 2021-03-24 NOTE — Telephone Encounter (Signed)
Copied from [place] [id number]. Topic: General - Other >> Mar 20, 2021  1:19 PM Leward Quan A wrote: Reason for CRM: Patient called in stating that her work day have gotten hectic and she will not be able to make it for her visit with Lurena Joiner today. But states that she need to come in for fasting labs and need to get an early appointment with [facility]. Please call to schedule [phone]   Called patient to get rescheduled for appt with [facility]. No answer and vm not set up. If patient returns call transfer to office or schedule appt with [facility]

## 2021-04-05 ENCOUNTER — Ambulatory Visit: Payer: BC Managed Care – PPO | Attending: Internal Medicine | Admitting: Clinical

## 2021-04-05 ENCOUNTER — Other Ambulatory Visit: Payer: Self-pay

## 2021-04-05 DIAGNOSIS — F4323 Adjustment disorder with mixed anxiety and depressed mood: Secondary | ICD-10-CM | POA: Diagnosis not present

## 2021-04-10 ENCOUNTER — Telehealth: Payer: Self-pay | Admitting: Clinical

## 2021-04-10 NOTE — BH Specialist Note (Signed)
Integrated Behavioral Health via Telemedicine Visit  04/05/2021 Shannon Ashley [id number]  Number of [place] visits: 4/6 Session Start time: 11:40am  Session End time: 12:15pm Total time: 35   Referring Provider: Karle Plumber, MD Patient/Family location: Daughter's home [facility] Provider location: [facility] Office All persons participating in visit: Pt and LCSWA Types of Service: Individual psychotherapy  I connected with Shannon Ashley via [employer]  (Video is Tree surgeon) and verified that I am speaking with the correct person using two identifiers. Discussed confidentiality: Yes   I discussed the limitations of telemedicine and the availability of in person appointments.  Discussed there is a possibility of technology failure and discussed alternative modes of communication if that failure occurs.  I discussed that engaging in this telemedicine visit, they consent to the provision of behavioral healthcare and the services will be billed under their insurance.  Patient and/or legal guardian expressed understanding and consented to Telemedicine visit: Yes   Presenting Concerns: Patient and/or family reports the following symptoms/concerns: Reports feeling anxious, worrying, trouble relaxing, irritability, restlessness, trouble sleeping, decreased energy, and trouble concentrating. Reports that she has noticed an improvement in her depressed feelings and trouble concentrating. Duration of problem: 1 year; Severity of problem: moderate  Patient and/or Family's Strengths/Protective Factors: Social and Emotional competence and Concrete supports in place (healthy food, safe environments, etc.)  Goals Addressed: Patient will:  Reduce symptoms of: anxiety, depression, and stress   Increase knowledge and/or ability of: coping skills and stress reduction   Demonstrate ability to: Increase healthy adjustment to current life  circumstances  Progress towards Goals: Ongoing  Interventions: Interventions utilized:  Mindfulness or Psychologist, educational, CBT Cognitive Behavioral Therapy, Supportive Counseling, and Psychoeducation and/or Health Education Standardized Assessments completed: GAD-7 and PHQ 9 GAD 7 : Generalized Anxiety Score 04/05/2021 03/15/2021 03/06/2021 02/01/2021  Nervous, Anxious, on Edge 3 3 1 2   Control/stop worrying 1 1 0 1  Worry too much - different things 2 0 0 1  Trouble relaxing 2 2 1 3   Restless 1 0 0 2  Easily annoyed or irritable 3 2 0 1  Afraid - awful might happen 1 0 0 1  Total GAD 7 Score 13 8 2 11   Anxiety Difficulty - - - -     Depression screen [facility] 2/9 04/05/2021 03/15/2021 03/06/2021 02/01/2021 09/01/2020  Decreased Interest 1 2 0 2 1  Down, Depressed, Hopeless 1 2 0 1 2  PHQ - 2 Score 2 4 0 3 3  Altered sleeping 2 2 1 1 1   Tired, decreased energy 2 3 2 3 3   Change in appetite 1 1 0 2 2  Feeling bad or failure about yourself  0 1 0 1 1  Trouble concentrating 3 2 0 2 2  Moving slowly or fidgety/restless 0 0 0 1 0  Suicidal thoughts 0 0 0 0 0  PHQ-9 Score 10 13 3 13 12   Difficult doing work/chores - - - - -    Patient and/or Family Response: Pt receptive to tx. Pt receptive to psychoeducation provided on stress, anxiety, and concentration. Pt receptive to cognitive restructuring. Pt will continue organization skills, deep breathing exercises, and incorporating daily self-care. Pt receptive to utilizing positive affirmations.   Assessment: Denies SI/HI. Denies auditory/visual hallucinations. No safety risks. Patient currently experiencing anxiety and depression as a result of stress at work and school. Pt continues to feel overwhelmed with school and work. Pt is utilizing coping skills to assist with  managing stress.   Patient may benefit from continued brief therapy. LCSWA provided psychoeducation on stress and anxiety. LCSWA utilized Adult nurse. LCSWA encouraged pt  to continue incorporating organization skills, deep breathing, and incorporating daily self-care. LCSWA encouraged pt to utilize positive affirmations. .  Plan: Follow up with behavioral health clinician on : 04/26/21 Behavioral recommendations: Utilize deep breathing exercises, organization skills, incorporating daily self-care, and positive affirmations.  Referral(s): [place] (In Clinic)  I discussed the assessment and treatment plan with the patient and/or parent/guardian. They were provided an opportunity to ask questions and all were answered. They agreed with the plan and demonstrated an understanding of the instructions.   They were advised to call back or seek an in-person evaluation if the symptoms worsen or if the condition fails to improve as anticipated.  Akila Batta C Makoto Sellitto, LCSW

## 2021-04-14 ENCOUNTER — Other Ambulatory Visit: Payer: Self-pay | Admitting: Internal Medicine

## 2021-04-14 DIAGNOSIS — F411 Generalized anxiety disorder: Secondary | ICD-10-CM

## 2021-04-17 NOTE — Telephone Encounter (Signed)
I called [place] at [phone] and spoke with Violet. Scheduled a psychiatry appt for pt for 05/03/21 at 11:10 am for a virtual visit. I contacted pt and informed her of this appt for psychiatry.

## 2021-04-26 ENCOUNTER — Ambulatory Visit: Payer: BC Managed Care – PPO | Attending: Internal Medicine | Admitting: Clinical

## 2021-04-26 DIAGNOSIS — F4323 Adjustment disorder with mixed anxiety and depressed mood: Secondary | ICD-10-CM

## 2021-04-27 ENCOUNTER — Other Ambulatory Visit: Payer: Self-pay

## 2021-04-28 NOTE — BH Specialist Note (Signed)
Integrated Behavioral Health via Telemedicine Visit  04/26/2021 Shannon Ashley [id number]  Number of [place] visits: 5 Session Start time: 11:30am  Session End time: 12:00pm Total time: 30  Referring Provider: Karle Plumber, MD Patient/Family location: Work [facility] Provider location: [facility] office All persons participating in visit: Pt and LCSWA  Types of Service: Individual psychotherapy  I connected with [name] via [employer]  (Video is Tree surgeon) and verified that I am speaking with the correct person using two identifiers. Discussed confidentiality: Yes   I discussed the limitations of telemedicine and the availability of in person appointments.  Discussed there is a possibility of technology failure and discussed alternative modes of communication if that failure occurs.  I discussed that engaging in this telemedicine visit, they consent to the provision of behavioral healthcare and the services will be billed under their insurance.  Patient and/or legal guardian expressed understanding and consented to Telemedicine visit: Yes   Presenting Concerns: Patient and/or family reports the following symptoms/concerns: Reports feeling anxious, trouble concentrating, and decreased energy. Reports that she continues to feel overwhelmed and stressed with work and school. Reports that her semester is about to end and that she is planning to take time off from work. Reports that she is having difficulty deciding if she wants to remain in her field of work due to the current stress. Pt also reports that she is improving her organization skills.  Duration of problem: 1 year; Severity of problem: moderate  Patient and/or Family's Strengths/Protective Factors: Social and Emotional competence and Concrete supports in place (healthy food, safe environments, etc.)  Goals Addressed: Patient will:  Reduce symptoms of: anxiety,  depression, and stress   Increase knowledge and/or ability of: coping skills and stress reduction   Demonstrate ability to: Increase healthy adjustment to current life circumstances  Progress towards Goals: Ongoing  Interventions: Interventions utilized:  Mindfulness or Psychologist, educational, CBT Cognitive Behavioral Therapy, and Supportive Counseling Standardized Assessments completed: Not Needed  Patient and/or Family Response: Pt receptive to tx. Pt receptive to empowerment to improve pt's motivation. Pt receptive to cognitive restructuring to assist with cognitive processing. Pt receptive to deep breathing exercises, positive affirmations, and continue daily self-care.  Assessment: Denies SI/HI. Denies auditory/visual hallucinations. Patient currently experiencing anxiety related to stress at work and school. 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 Pt has upcoming appt for psychiatry. Pt is also incorporating daily self-care and has improved her sleep. Pt continues to have difficulty with concentrating.   Patient may benefit from continued brief therapy. LCSWA provided empowerment to assist with motivation. LCSWA utilized cognitive restructuring and assisted with cognitive processing skills. LCSWA encouraged pt to utilize deep breathing, positive affirmations, and continue daily self-care.  Plan: Follow up with behavioral health clinician on : 05/17/21 Behavioral recommendations:Utilize deep breathing, positive affirmations, and daily self-care. Continue improving organization skills.  Referral(s): [place] (In Clinic)  I discussed the assessment and treatment plan with the patient and/or parent/guardian. They were provided an opportunity to ask questions and all were answered. They agreed with the plan and demonstrated an understanding of the instructions.   They were advised to call back or seek an in-person evaluation  if the symptoms worsen or if the condition fails to improve as anticipated.  Ceniya Fowers C Valeen Borys, LCSW

## 2021-05-02 ENCOUNTER — Other Ambulatory Visit: Payer: Self-pay

## 2021-05-02 ENCOUNTER — Ambulatory Visit: Payer: BC Managed Care – PPO | Admitting: Dermatology

## 2021-05-02 DIAGNOSIS — L720 Epidermal cyst: Secondary | ICD-10-CM | POA: Diagnosis not present

## 2021-05-02 DIAGNOSIS — D485 Neoplasm of uncertain behavior of skin: Secondary | ICD-10-CM

## 2021-05-02 MED ORDER — MUPIROCIN 2 % EX OINT: 1.0000 "application " | TOPICAL_OINTMENT | Freq: Every day | CUTANEOUS | 0 refills | Status: DC

## 2021-05-02 NOTE — Progress Notes (Signed)
   Follow-Up Visit   Subjective  Shannon Ashley is a 55 y.o. female who presents for the following: Cyst (Left post lat thigh - excise today).  The following portions of the chart were reviewed this encounter and updated as appropriate:   Tobacco  Allergies  Meds  Problems  Med Hx  Surg Hx  Fam Hx     Review of Systems:  No other skin or systemic complaints except as noted in HPI or Assessment and Plan.  Objective  Well appearing patient in no apparent distress; mood and affect are within normal limits.  A focused examination was performed including left leg. Relevant physical exam findings are noted in the Assessment and Plan.  Left post lat thigh Cystic papule 2.5 cm   Assessment & Plan  Neoplasm of uncertain behavior of skin Left post lat thigh  Skin excision  Lesion length (cm):  2.5 Lesion width (cm):  2.5 Margin per side (cm):  0 Total excision diameter (cm):  2.5 Informed consent: discussed and consent obtained   Timeout: patient name, date of birth, surgical site, and procedure verified   Procedure prep:  Patient was prepped and draped in usual sterile fashion Prep type:  Isopropyl alcohol and povidone-iodine Anesthesia: the lesion was anesthetized in a standard fashion   Anesthetic:  1% lidocaine w/ epinephrine 1-100,000 buffered w/ 8.4% NaHCO3 Instrument used: #15 blade   Hemostasis achieved with: pressure   Hemostasis achieved with comment:  Electrocautery Outcome: patient tolerated procedure well with no complications   Post-procedure details: sterile dressing applied and wound care instructions given   Dressing type: bandage and pressure dressing (mupirocin)    Skin repair Complexity:  Complex Final length (cm):  3 Reason for type of repair: reduce tension to allow closure, reduce the risk of dehiscence, infection, and necrosis, reduce subcutaneous dead space and avoid a hematoma, allow closure of the large defect, preserve normal anatomy, preserve  normal anatomical and functional relationships and enhance both functionality and cosmetic results   Undermining: area extensively undermined   Undermining comment:  Undermining defect 2.5 cm Subcutaneous layers (deep stitches):  Suture size:  2-0 Suture type: Vicryl (polyglactin 910)   Subcutaneous suture technique: inverted dermal. Fine/surface layer approximation (top stitches):  Suture size:  3-0 Suture type: nylon   Stitches: simple running   Suture removal (days):  7 Hemostasis achieved with: suture and pressure Outcome: patient tolerated procedure well with no complications   Post-procedure details: sterile dressing applied and wound care instructions given   Dressing type: bandage and pressure dressing (mupirocin)    mupirocin ointment (BACTROBAN) 2 % Apply 1 application topically daily. Qd to excision site with dressing changes  Specimen 1 - Surgical pathology Differential Diagnosis: Cyst vs other Check Margins: No  Return in about 1 week (around 05/09/2021) for suture removal.  I, Ashok Cordia, CMA, am acting as scribe for Sarina Ser, MD . Documentation: I have reviewed the above documentation for accuracy and completeness, and I agree with the above.  Sarina Ser, MD

## 2021-05-02 NOTE — Patient Instructions (Signed)

## 2021-05-03 DIAGNOSIS — F331 Major depressive disorder, recurrent, moderate: Secondary | ICD-10-CM | POA: Diagnosis not present

## 2021-05-03 DIAGNOSIS — F419 Anxiety disorder, unspecified: Secondary | ICD-10-CM | POA: Diagnosis not present

## 2021-05-04 ENCOUNTER — Telehealth: Payer: Self-pay

## 2021-05-04 NOTE — Telephone Encounter (Signed)
Spoke with patient regarding surgery. She is doing fine/hd 

## 2021-05-09 ENCOUNTER — Other Ambulatory Visit: Payer: Self-pay

## 2021-05-09 ENCOUNTER — Ambulatory Visit: Payer: BC Managed Care – PPO | Admitting: Dermatology

## 2021-05-09 DIAGNOSIS — D485 Neoplasm of uncertain behavior of skin: Secondary | ICD-10-CM

## 2021-05-09 DIAGNOSIS — L720 Epidermal cyst: Secondary | ICD-10-CM | POA: Diagnosis not present

## 2021-05-09 DIAGNOSIS — Z4802 Encounter for removal of sutures: Secondary | ICD-10-CM

## 2021-05-09 DIAGNOSIS — L918 Other hypertrophic disorders of the skin: Secondary | ICD-10-CM | POA: Diagnosis not present

## 2021-05-09 NOTE — Patient Instructions (Signed)

## 2021-05-09 NOTE — Progress Notes (Signed)
° °  Follow-Up Visit   Subjective  Shannon Ashley is a 55 y.o. female who presents for the following: post op/suture removal  (For pathology proven cyst of the L post lat thigh - patient is here for suture removal). Patient also c/o irritated skin lesion of L inframammary that is irritated by her bra.  The following portions of the chart were reviewed this encounter and updated as appropriate:   Tobacco   Allergies   Meds   Problems   Med Hx   Surg Hx   Fam Hx      Review of Systems:  No other skin or systemic complaints except as noted in HPI or Assessment and Plan.  Objective  Well appearing patient in no apparent distress; mood and affect are within normal limits.  A focused examination was performed including the trunk and extremities. Relevant physical exam findings are noted in the Assessment and Plan.  L infra mammary 0.5 cm flesh colored papule.   L post lat thigh Healing excision site.    Assessment & Plan  Neoplasm of uncertain behavior of skin L infra mammary  Epidermal / dermal shaving  Lesion diameter (cm):  0.5 Informed consent: discussed and consent obtained   Timeout: patient name, date of birth, surgical site, and procedure verified   Procedure prep:  Patient was prepped and draped in usual sterile fashion Prep type:  Isopropyl alcohol Anesthesia: the lesion was anesthetized in a standard fashion   Anesthetic:  1% lidocaine w/ epinephrine 1-100,000 buffered w/ 8.4% NaHCO3 Instrument used: flexible razor blade   Hemostasis achieved with: pressure, aluminum chloride and electrodesiccation   Outcome: patient tolerated procedure well   Post-procedure details: sterile dressing applied and wound care instructions given   Dressing type: bandage and petrolatum    Specimen 1 - Surgical pathology Differential Diagnosis: D48.5 irritated nevus vs irritated skin tag r/o dysplasia Check Margins: No  Epidermal inclusion cyst L post lat thigh  Encounter for Removal  of Sutures - Incision site at the L post lat thigh is clean, dry and intact - Wound cleansed, sutures removed, wound cleansed and steri strips applied.  - Discussed pathology results showing a benign cyst.  - Patient advised to keep steri-strips dry until they fall off. - Scars remodel for a full year. - Once steri-strips fall off, patient can apply over-the-counter silicone scar cream each night to help with scar remodeling if desired. - Patient advised to call with any concerns or if they notice any new or changing lesions.  Return if symptoms worsen or fail to improve.  Luther Redo, CMA, am acting as scribe for Sarina Ser, MD . Documentation: I have reviewed the above documentation for accuracy and completeness, and I agree with the above.  Sarina Ser, MD

## 2021-05-12 ENCOUNTER — Encounter: Payer: Self-pay | Admitting: Dermatology

## 2021-05-14 ENCOUNTER — Encounter: Payer: Self-pay | Admitting: Dermatology

## 2021-05-15 ENCOUNTER — Telehealth: Payer: Self-pay

## 2021-05-15 NOTE — Telephone Encounter (Signed)
Discussed biopsy results with pt  °

## 2021-05-15 NOTE — Telephone Encounter (Signed)
-----   Message from Ralene Bathe, MD sent at 05/11/2021  5:49 PM EST ----- Diagnosis Skin , L infra mammary [place]  Benign skin tag No further treatment needed

## 2021-05-17 ENCOUNTER — Ambulatory Visit: Payer: BC Managed Care – PPO | Admitting: Clinical

## 2021-05-17 DIAGNOSIS — F419 Anxiety disorder, unspecified: Secondary | ICD-10-CM | POA: Diagnosis not present

## 2021-05-17 DIAGNOSIS — F331 Major depressive disorder, recurrent, moderate: Secondary | ICD-10-CM | POA: Diagnosis not present

## 2021-06-07 ENCOUNTER — Encounter: Payer: BC Managed Care – PPO | Admitting: Clinical

## 2021-06-21 ENCOUNTER — Other Ambulatory Visit: Payer: Self-pay

## 2021-06-21 ENCOUNTER — Ambulatory Visit: Payer: BC Managed Care – PPO | Attending: Internal Medicine | Admitting: Clinical

## 2021-06-21 DIAGNOSIS — F4323 Adjustment disorder with mixed anxiety and depressed mood: Secondary | ICD-10-CM | POA: Diagnosis not present

## 2021-06-27 NOTE — BH Specialist Note (Signed)
Integrated Behavioral Health via Telemedicine Visit  06/21/2021 Shannon Ashley [id number]  Number of [place] visits:6  Session Start time: 2:10pm  Session End time: 2:46pm Total time:  36 minutes  Referring Provider: Karle Plumber, MD Patient/Family location: Home [facility] Provider location: Home All persons participating in visit: Pt and LCSWA Types of Service: Individual psychotherapy  I connected with Shannon Ashley via [employer]  (Video is Tree surgeon) and verified that I am speaking with the correct person using two identifiers. Discussed confidentiality: Yes   I discussed the limitations of telemedicine and the availability of in person appointments.  Discussed there is a possibility of technology failure and discussed alternative modes of communication if that failure occurs.  I discussed that engaging in this telemedicine visit, they consent to the provision of behavioral healthcare and the services will be billed under their insurance.  Patient and/or legal guardian expressed understanding and consented to Telemedicine visit: Yes   Presenting Concerns: Patient and/or family reports the following symptoms/concerns: Reports feeling down at times, anxious, and trouble concentrating. Reports her cousin whom she was close to recently passed away. Reports that she didn't really have a break from school or work due to having to help her family with the death of her cousin. Duration of problem: 1 year; Severity of problem: moderate  Patient and/or Family's Strengths/Protective Factors: Social and Emotional competence and Concrete supports in place (healthy food, safe environments, etc.)  Goals Addressed: Patient will:  Reduce symptoms of: anxiety, compulsions, and stress   Increase knowledge and/or ability of: coping skills and stress reduction   Demonstrate ability to: Increase healthy adjustment to current life  circumstances  Progress towards Goals: Revised and Ongoing  Interventions: Interventions utilized:  Mindfulness or Psychologist, educational, CBT Cognitive Behavioral Therapy, and Supportive Counseling Standardized Assessments completed: Not Needed  Patient and/or Family Response: Pt receptive to tx. Pt receptive to psychoeducation provided on grief. Pt receptive to increasing daily self-care. Pt will continue adhering to medication as she had appt with [facility]   Assessment: Denies SI/HI. Patient currently experiencing anxiety and depression related to stress and grief. Pt appears to be overwhelmed and frequently helps others. Pt has improved her sleep at night.   Patient may benefit from continuing medication. LCSWA provided psychoeducation on grief. LCSWA encouraged pt to increase daily self-care and continue adhering to medication.  Plan: Follow up with behavioral health clinician on : 07/12/21 Behavioral recommendations: Continue adhering to medication and increase daily self-care. Referral(s): [place] (In Clinic)  I discussed the assessment and treatment plan with the patient and/or parent/guardian. They were provided an opportunity to ask questions and all were answered. They agreed with the plan and demonstrated an understanding of the instructions.   They were advised to call back or seek an in-person evaluation if the symptoms worsen or if the condition fails to improve as anticipated.  Matthews Franks C Aika Brzoska, LCSW

## 2021-07-07 ENCOUNTER — Encounter: Payer: Self-pay | Admitting: Internal Medicine

## 2021-07-07 ENCOUNTER — Ambulatory Visit: Payer: BC Managed Care – PPO | Attending: Internal Medicine | Admitting: Internal Medicine

## 2021-07-07 ENCOUNTER — Other Ambulatory Visit: Payer: Self-pay

## 2021-07-07 VITALS — BP 114/77 | HR 74 | Resp 16 | Wt 199.2 lb

## 2021-07-07 DIAGNOSIS — R079 Chest pain, unspecified: Secondary | ICD-10-CM | POA: Diagnosis not present

## 2021-07-07 DIAGNOSIS — F411 Generalized anxiety disorder: Secondary | ICD-10-CM | POA: Diagnosis not present

## 2021-07-07 DIAGNOSIS — E782 Mixed hyperlipidemia: Secondary | ICD-10-CM

## 2021-07-07 DIAGNOSIS — I10 Essential (primary) hypertension: Secondary | ICD-10-CM | POA: Diagnosis not present

## 2021-07-07 NOTE — Progress Notes (Signed)
Patient ID: VIDEL NOBREGA, female    DOB: [birth year]  MRN: [id number]  CC: Hypertension   Subjective: Shannon Ashley is a 56 y.o. female who presents for chronic ds management Her concerns today include:  Patient with history of HL, HTN, anxiety disorder, MDD.   HTN:  checking BP regularly.  Reports readings have been good. Reports compliance with taking Norvasc, Cozaar and Lozol Taking and tolerating Crestor for HL  Anx/dep:  Working 32 hrs and started class again 2 nights a wk.  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  Several nights she had to get in bed earlier.  She is getting 7-8 hrs of sleep.  Gets up at  6 a.m  -trying to get through the sudden death of a very close cousin who passed in late December -continues counseling sessions with LCSW Ms. Leilani Merl.  She is plugged in with psychiatrist Dr. Darliss Cheney.  He stopped Buspar. Now on Clonazepam once a day and Trazodone at bedtime to help with sleep.  C/o intermittent LT sided CP below LT breast x one mth.  Occurs about 2-3/wk and can last 1 hr.   Pressure like.   Sometimes it starts as feeling of indigestion LT side of chest below breast. No radiation. No burning in stomach but burps sometimes  Can occur with rest or activity Father had HD Patient Active Problem List   Diagnosis Date Noted   GAD (generalized anxiety disorder) 09/01/2020   Encounter for screening fecal occult blood testing 01/01/2020   Encounter for well woman exam with routine gynecological exam 01/01/2020   Screening for colorectal cancer 07/11/2018   Encounter for gynecological examination with Papanicolaou smear of cervix 07/11/2018   Menopause present, declines hormone replacement therapy 07/11/2018   Major depressive disorder, single episode, mild ([place]) 04/23/2018   Spotting 08/08/2016   Pelvic cramping 08/08/2016   Encounter for routine gynecological examination with  Papanicolaou smear of cervix 08/08/2016   Mixed stress and urge urinary incontinence 08/08/2016   Vitamin D deficiency 02/03/2016   History of colonic polyps    Diverticulosis of colon without hemorrhage    Abdominal pain 09/30/2014   Bowel habit changes 09/21/2014   Rectal bleeding 09/21/2014   Hemorrhoids 09/21/2014   Hyperlipidemia 08/31/2014   Anxiety 07/07/2013   Hypertension 07/07/2013   Elevated cholesterol 07/07/2013     Current Outpatient Medications on File Prior to Visit  Medication Sig Dispense Refill   amLODipine (NORVASC) 5 MG tablet Take 1 tablet (5 mg total) by mouth daily. 30 tablet 4   BIOTIN PO Take 1 tablet by mouth daily.      cetirizine (ZYRTEC) 10 MG tablet Take 10 mg by mouth daily as needed for allergies.      clonazePAM (KLONOPIN) 0.5 MG tablet Take 0.5 mg by mouth daily as needed.     escitalopram (LEXAPRO) 20 MG tablet Take 1 tablet (20 mg total) by mouth daily. 30 tablet 5   fluticasone (FLONASE) 50 MCG/ACT nasal spray USE TWO SPRAYS IN BOTH NOSTRILS DAILY 16 g 2   indapamide (LOZOL) 2.5 MG tablet TAKE ONE TABLET (2.5MG  TOTAL) BY MOUTH DAILY 30 tablet 5   losartan (COZAAR) 100 MG tablet TAKE ONE TABLET (100MG  TOTAL) BY MOUTH DAILY 30 tablet 5   mometasone (ELOCON) 0.1 % cream Apply 1 application topically daily as needed (Rash). 45 g 1   Multiple Vitamin (MULTIVITAMIN)  tablet Take 1 tablet by mouth daily.     rosuvastatin (CRESTOR) 10 MG tablet Take 1 tablet (10 mg total) by mouth daily. 30 tablet 5   traZODone (DESYREL) 50 MG tablet Take 50 mg by mouth at bedtime.     No current facility-administered medications on file prior to visit.    Allergies  Allergen Reactions   Lisinopril Cough    Social History   Socioeconomic History   Marital status: Divorced    Spouse name: Not on file   Number of children: Not on file   Years of education: Not on file   Highest education level: Not on file  Occupational History   Not on file  Tobacco Use    Smoking status: Never   Smokeless tobacco: Never  Vaping Use   Vaping Use: Never used  Substance and Sexual Activity   Alcohol use: Yes    Alcohol/week: 4.0 standard drinks    Types: 4 Glasses of wine per week    Comment: occ wine   Drug use: No   Sexual activity: Yes    Birth control/protection: Surgical    Comment: tubal and ablation  Other Topics Concern   Not on file  Social History Narrative   Not on file   Social Determinants of Health   Financial Resource Strain: Not on file  Food Insecurity: Not on file  Transportation Needs: Not on file  Physical Activity: Not on file  Stress: Not on file  Social Connections: Not on file  Intimate Partner Violence: Not on file    Family History  Problem Relation Age of Onset   Cancer Mother        lung   Hypertension Mother    Stroke Father    Heart attack Father    Congestive Heart Failure Father    Kidney failure Father    Colon polyps Father    Hypertension Sister    Drug abuse Sister    Diabetes Maternal Uncle    Early death Paternal Aunt    Asthma Paternal Aunt    Cancer Maternal Grandmother        lung   Congestive Heart Failure Paternal Grandmother    Cancer Paternal Grandmother        pancreatic    Past Surgical History:  Procedure Laterality Date   COLONOSCOPY N/A 10/13/2014   Procedure: COLONOSCOPY;  Surgeon: Daneil Dolin, MD;  [facility];  Service: Endoscopy;  Laterality: N/A;  830    COLONOSCOPY N/A 12/02/2018   Procedure: COLONOSCOPY;  Surgeon: Daneil Dolin, MD;  [facility]: [facility];  Service: Endoscopy;  Laterality: N/A;  8:30am   ECTOPIC PREGNANCY SURGERY     ENDOMETRIAL ABLATION     POLYPECTOMY  12/02/2018   Procedure: POLYPECTOMY;  Surgeon: Daneil Dolin, MD;  Location: [facility];  Service: Endoscopy;;  colon   TUBAL LIGATION      ROS: Review of Systems Negative except as stated above  PHYSICAL EXAM: BP 114/77    Pulse 74    Resp 16    Wt 199 lb 3.2 oz (90.4 kg)     SpO2 97%    BMI 31.20 kg/m   Physical Exam  General appearance - alert, well appearing, middle age AAF and in no distress Mental status - normal mood, behavior, speech, dress, motor activity, and thought processes Neck - supple, no significant adenopathy Chest - clear to auscultation, no wheezes, rales or rhonchi, symmetric air entry Heart -  normal rate, regular rhythm, normal S1, S2, no murmurs, rubs, clicks or gallops Extremities - peripheral pulses normal, no pedal edema, no clubbing or cyanosis   CMP Latest Ref Rng & Units 09/01/2020 04/21/2018 07/06/2017  Glucose 65 - 99 mg/dL 81 - 83  BUN 6 - 24 mg/dL 11 - 10  Creatinine 0.57 - 1.00 mg/dL 0.84 - 0.88  Sodium 134 - 144 mmol/L 138 - 140  Potassium 3.5 - 5.2 mmol/L 4.4 - 4.3  Chloride 96 - 106 mmol/L 99 - 104  CO2 20 - 29 mmol/L 26 - 21  Calcium 8.7 - 10.2 mg/dL 10.0 - 9.3  Total Protein 6.0 - 8.5 g/dL 7.8 6.8 7.4  Total Bilirubin 0.0 - 1.2 mg/dL 0.2 <0.2 0.3  Alkaline Phos 44 - 121 IU/L 74 57 63  AST 0 - 40 IU/L 24 23 21   ALT 0 - 32 IU/L 24 35(H) 29   Lipid Panel     Component Value Date/Time   CHOL 264 (H) 09/01/2020 1500   TRIG 84 09/01/2020 1500   HDL 60 09/01/2020 1500   CHOLHDL 4.4 09/01/2020 1500   CHOLHDL 3.5 07/07/2013 0934   VLDL 11 07/07/2013 0934   LDLCALC 190 (H) 09/01/2020 1500    CBC    Component Value Date/Time   WBC 6.2 09/01/2020 1500   WBC 7.3 07/07/2013 0934   RBC 4.30 09/01/2020 1500   RBC 4.02 07/07/2013 0934   HGB 13.8 09/01/2020 1500   HCT 41.0 09/01/2020 1500   PLT 318 09/01/2020 1500   MCV 95 09/01/2020 1500   MCH 32.1 09/01/2020 1500   MCH 32.1 07/07/2013 0934   MCHC 33.7 09/01/2020 1500   MCHC 34.5 07/07/2013 0934   RDW 12.8 09/01/2020 1500    ASSESSMENT AND PLAN: 1. Essential hypertension At goal.  Continue current meds and low salt diet - CBC - Comprehensive metabolic panel  2. Chest pain in adult -hx is suspicious and pt has risk factors for HD.  Will refer to cardiology  for eval - Ambulatory referral to Cardiology  3. Mixed hyperlipidemia Continue crestor - Lipid panel  4. GAD (generalized anxiety disorder) Doing better on Clonazepam, Trazodone and Lexapro.  Good sleep hygiene encouraged.  Try to get in 7-8 hrs consistently    Patient was given the opportunity to ask questions.  Patient verbalized understanding of the plan and was able to repeat key elements of the plan.   Orders Placed This Encounter  Procedures   CBC   Comprehensive metabolic panel   Lipid panel   Ambulatory referral to Cardiology     Requested Prescriptions    No prescriptions requested or ordered in this encounter    Return in about 4 months (around 11/04/2021).  Karle Plumber, MD, FACP

## 2021-07-10 ENCOUNTER — Other Ambulatory Visit: Payer: BC Managed Care – PPO

## 2021-07-12 ENCOUNTER — Ambulatory Visit: Payer: BC Managed Care – PPO | Attending: Internal Medicine | Admitting: Clinical

## 2021-07-12 DIAGNOSIS — F4323 Adjustment disorder with mixed anxiety and depressed mood: Secondary | ICD-10-CM | POA: Diagnosis not present

## 2021-07-13 NOTE — BH Specialist Note (Signed)
Integrated Behavioral Health via Telemedicine Visit  07/12/2021 SARAFINA PUTHOFF [id number]  Number of [place] Clinician visits: Additional Visit  Session Start time: 1440   Session End time: 3383  Total time in minutes: 30   Referring Provider: Karle Plumber, MD Patient/Family location: Home [facility] Provider location: [facility] office All persons participating in visit: Pt and LCSW Types of Service: Individual psychotherapy  I connected with [name] via [employer]  (Video is Tree surgeon) and verified that I am speaking with the correct person using two identifiers. Discussed confidentiality: Yes   I discussed the limitations of telemedicine and the availability of in person appointments.  Discussed there is a possibility of technology failure and discussed alternative modes of communication if that failure occurs.  I discussed that engaging in this telemedicine visit, they consent to the provision of behavioral healthcare and the services will be billed under their insurance.  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 Reports that she is ready to graduate in May so that she can find a better job. Reports an improvement in sleep. Reports difficulty with daily self-care. Duration of problem: 1 year; Severity of problem: moderate  Patient and/or Family's Strengths/Protective Factors: Social and Emotional competence and Concrete supports in place (healthy food, safe environments, etc.)  Goals Addressed: Patient will:  Reduce symptoms of: anxiety, depression, and stress   Increase knowledge and/or ability of: coping skills and stress reduction   Demonstrate ability to: Increase healthy adjustment to current life  circumstances  Progress towards Goals: Ongoing  Interventions: Interventions utilized:  Mindfulness or Psychologist, educational, CBT Cognitive Behavioral Therapy, and Supportive Counseling Standardized Assessments completed: Not Needed  Patient and/or Family Response: Pt receptive to tx. Pt receptive to affirmation provided on pt's progress. Pt receptive to incorporating relaxation daily. Pt receptive to cognitive restructuring. Pt receptive to deep breathing and continuing to adhere to medication (Pt newly prescribed Klonipin from [facility]).   Assessment: Denies SI/HI. Patient currently experiencing stress related to school, internship, and work. Pt appears overwhelmed and has limited time to relax. Pt has improved her sleep by going to bed earlier and adhering to medication.  Patient may benefit from continuing medication. LCSW provided affirmation for pt's progress. LCSW utilized Adult nurse. LCSW encouraged pt to utilize deep breathing, incorporate relaxation daily, and continue adhering to medication. LCSW will fu with pt for an additional session and will provided resources for outpatient therapy if needed.  Plan: Follow up with behavioral health clinician on : 08/09/2021 Behavioral recommendations: Utilize deep breathing, incorporate relaxation daily, and continue adhering to medication. Referral(s): [place] (In Clinic)  I discussed the assessment and treatment plan with the patient and/or parent/guardian. They were provided an opportunity to ask questions and all were answered. They agreed with the plan and demonstrated an understanding of the instructions.   They were advised to call back or seek an in-person evaluation if the symptoms worsen or if the condition fails to improve as anticipated.  Jilliana Burkes C Fartun Paradiso, LCSW

## 2021-07-21 ENCOUNTER — Ambulatory Visit: Payer: BC Managed Care – PPO | Attending: Internal Medicine

## 2021-07-21 DIAGNOSIS — I10 Essential (primary) hypertension: Secondary | ICD-10-CM | POA: Diagnosis not present

## 2021-07-21 DIAGNOSIS — E782 Mixed hyperlipidemia: Secondary | ICD-10-CM | POA: Diagnosis not present

## 2021-07-22 ENCOUNTER — Other Ambulatory Visit: Payer: Self-pay | Admitting: Internal Medicine

## 2021-07-22 LAB — LIPID PANEL
Chol/HDL Ratio: 3.3 ratio (ref 0.0–4.4)
Cholesterol, Total: 194 mg/dL (ref 100–199)
HDL: 58 mg/dL (ref >39)
LDL Chol Calc (NIH): 125 mg/dL — ABNORMAL HIGH (ref 0–99)
Triglycerides: 58 mg/dL (ref 0–149)
VLDL Cholesterol Cal: 11 mg/dL (ref 5–40)

## 2021-07-22 LAB — COMPREHENSIVE METABOLIC PANEL
ALT: 19 IU/L (ref 0–32)
AST: 19 IU/L (ref 0–40)
Albumin/Globulin Ratio: 1.6 (ref 1.2–2.2)
Albumin: 4.7 g/dL (ref 3.8–4.9)
Alkaline Phosphatase: 78 IU/L (ref 44–121)
BUN/Creatinine Ratio: 15 (ref 9–23)
BUN: 13 mg/dL (ref 6–24)
Bilirubin Total: 0.3 mg/dL (ref 0.0–1.2)
CO2: 26 mmol/L (ref 20–29)
Calcium: 10.2 mg/dL (ref 8.7–10.2)
Chloride: 102 mmol/L (ref 96–106)
Creatinine, Ser: 0.88 mg/dL (ref 0.57–1.00)
Globulin, Total: 2.9 g/dL (ref 1.5–4.5)
Glucose: 94 mg/dL (ref 70–99)
Potassium: 4.2 mmol/L (ref 3.5–5.2)
Sodium: 140 mmol/L (ref 134–144)
Total Protein: 7.6 g/dL (ref 6.0–8.5)
eGFR: 79 mL/min/{1.73_m2} (ref >59)

## 2021-07-22 LAB — CBC
Hematocrit: 40.1 % (ref 34.0–46.6)
Hemoglobin: 13.8 g/dL (ref 11.1–15.9)
MCH: 31.8 pg (ref 26.6–33.0)
MCHC: 34.4 g/dL (ref 31.5–35.7)
MCV: 92 fL (ref 79–97)
Platelets: 310 10*3/uL (ref 150–450)
RBC: 4.34 x10E6/uL (ref 3.77–5.28)
RDW: 12.5 % (ref 11.7–15.4)
WBC: 4 10*3/uL (ref 3.4–10.8)

## 2021-07-22 NOTE — Progress Notes (Signed)
Let pt know that her cholesterol is still elevated but improved from 10 months ago.  Goal is less than 100.  Previous # was 190.  Now 125. Continue Rosuvastatin and try to eat healthy.  Blood cell counts are normal.  Kidney and liver function tests are normal.

## 2021-07-24 NOTE — Telephone Encounter (Signed)
Requested Prescriptions  Pending Prescriptions Disp Refills   rosuvastatin (CRESTOR) 10 MG tablet [Pharmacy Med Name: ROSUVASTATIN CALCIUM 10 MG TAB] 30 tablet 5    Sig: TAKE ONE (1) TABLET BY MOUTH EVERY DAY     Cardiovascular:  Antilipid - Statins 2 Failed - 07/22/2021  1:33 PM      Failed - Lipid Panel in normal range within the last 12 months    Cholesterol, Total  Date Value Ref Range Status  07/21/2021 194 100 - 199 mg/dL Final   LDL Chol Calc (NIH)  Date Value Ref Range Status  07/21/2021 125 (H) 0 - 99 mg/dL Final   HDL  Date Value Ref Range Status  07/21/2021 58 >39 mg/dL Final   Triglycerides  Date Value Ref Range Status  07/21/2021 58 0 - 149 mg/dL Final         Passed - Cr in normal range and within 360 days    Creat  Date Value Ref Range Status  10/12/2014 0.80 0.50 - 1.10 mg/dL Final   Creatinine, Ser  Date Value Ref Range Status  07/21/2021 0.88 0.57 - 1.00 mg/dL Final         Passed - Patient is not pregnant      Passed - Valid encounter within last 12 months    Recent Outpatient Visits          2 weeks ago Essential hypertension   [place], Dalbert Batman, MD   4 months ago Essential hypertension   [place], Deborah B, MD   6 months ago Essential hypertension   [place], MD   10 months ago Establishing care with new doctor, encounter for   [place], MD      Future Appointments            In 3 months Ladell Pier, MD [place]            losartan (COZAAR) 100 MG tablet [Pharmacy Med Name: LOSARTAN POTASSIUM 100 MG TAB] 30 tablet 5    Sig: TAKE ONE TABLET (100MG  TOTAL) BY MOUTH DAILY     Cardiovascular:  Angiotensin Receptor Blockers Passed - 07/22/2021  1:33 PM      Passed - Cr in normal range and within 180 days    Creat   Date Value Ref Range Status  10/12/2014 0.80 0.50 - 1.10 mg/dL Final   Creatinine, Ser  Date Value Ref Range Status  07/21/2021 0.88 0.57 - 1.00 mg/dL Final         Passed - K in normal range and within 180 days    Potassium  Date Value Ref Range Status  07/21/2021 4.2 3.5 - 5.2 mmol/L Final         Passed - Patient is not pregnant      Passed - Last BP in normal range    BP Readings from Last 1 Encounters:  07/07/21 114/77         Passed - Valid encounter within last 6 months    Recent Outpatient Visits          2 weeks ago Essential hypertension   [place], MD   4 months ago Essential hypertension   [place] Ladell Pier, MD   6 months ago  Essential hypertension   [place] Ladell Pier, MD   10 months ago Establishing care with new doctor, encounter for   [place], MD      Future Appointments            In 3 months Wynetta Emery Dalbert Batman, MD [place]            escitalopram (LEXAPRO) 20 MG tablet [Pharmacy Med Name: ESCITALOPRAM OXALATE 20 MG TAB] 30 tablet 5    Sig: TAKE ONE TABLET (20MG  TOTAL) BY MOUTH DAILY     Psychiatry:  Antidepressants - SSRI Passed - 07/22/2021  1:33 PM      Passed - Completed PHQ-2 or PHQ-9 in the last 360 days      Passed - Valid encounter within last 6 months    Recent Outpatient Visits          2 weeks ago Essential hypertension   [place], MD   4 months ago Essential hypertension   [place], MD   6 months ago Essential hypertension   [place], MD   10 months ago Establishing care with new doctor, encounter for   [place], MD      Future Appointments            In 3 months Wynetta Emery Dalbert Batman, MD [place]

## 2021-07-26 DIAGNOSIS — F331 Major depressive disorder, recurrent, moderate: Secondary | ICD-10-CM | POA: Diagnosis not present

## 2021-07-26 DIAGNOSIS — F419 Anxiety disorder, unspecified: Secondary | ICD-10-CM | POA: Diagnosis not present

## 2021-08-09 ENCOUNTER — Encounter: Payer: BC Managed Care – PPO | Admitting: Clinical

## 2021-10-11 ENCOUNTER — Other Ambulatory Visit: Payer: Self-pay | Admitting: Internal Medicine

## 2021-10-18 DIAGNOSIS — F331 Major depressive disorder, recurrent, moderate: Secondary | ICD-10-CM | POA: Diagnosis not present

## 2021-10-18 DIAGNOSIS — F419 Anxiety disorder, unspecified: Secondary | ICD-10-CM | POA: Diagnosis not present

## 2021-11-10 ENCOUNTER — Ambulatory Visit: Payer: BC Managed Care – PPO | Attending: Internal Medicine | Admitting: Internal Medicine

## 2021-11-10 VITALS — BP 120/81 | HR 64 | Temp 98.0°F | Ht 67.0 in | Wt 202.6 lb

## 2021-11-10 DIAGNOSIS — I10 Essential (primary) hypertension: Secondary | ICD-10-CM | POA: Diagnosis not present

## 2021-11-10 DIAGNOSIS — R5383 Other fatigue: Secondary | ICD-10-CM

## 2021-11-10 DIAGNOSIS — E669 Obesity, unspecified: Secondary | ICD-10-CM

## 2021-11-10 DIAGNOSIS — R079 Chest pain, unspecified: Secondary | ICD-10-CM | POA: Diagnosis not present

## 2021-11-10 DIAGNOSIS — E782 Mixed hyperlipidemia: Secondary | ICD-10-CM | POA: Diagnosis not present

## 2021-11-10 DIAGNOSIS — R0683 Snoring: Secondary | ICD-10-CM

## 2021-11-10 NOTE — Patient Instructions (Addendum)
Work on improving meal planning.

## 2021-11-10 NOTE — Progress Notes (Signed)
Patient ID: Shannon Ashley, female    DOB: 07/14/1965  MRN: [id number]  CC: Hypertension   Subjective: Shannon Ashley is a 56 y.o. female who presents for chronic ds management Her concerns today include:  Patient with history of HL, HTN, anxiety disorder, MDD.   CP: c/o intermittent pressure like LT sided CP below LT breast on last visit.  She was referred to cardiology.  Patient acknowledges receiving a letter from cardiology about calling them to schedule an appointment but during that time she was very busy with school and work and never got around to calling them back.  She is now graduated with her bachelor's degree. Still gets episodes of chest pains.  She would like for me to resubmit referral to cardiology.  HTN:  compliant with meds that includes Cozaar 100 mg daily, Norvasc 5 mg daily and Lozol Checks BP.  Elev when day stressful or when she did not get adequate sleep Limits salt in foods HL:  taking and tolerating Crestor  Dep/Anx: She was getting some counseling sessions with our our LCSW who has since left.   Still followed by Dr. Alethia Berthold on trazodone and clonazepam as needed.  Obesity: Does well  most days with her eating habits "I am a stress eater."  When she is busy she grabs anything She graduated last mth with BS. Trying to find a new position.   Pt continues to c/o fatigue despite getting in 7 hrs sleep at nights.  Endorses nonrestorative sleep.    Endorses loud snoring   HM:  declines shingles vaccine.  PAP scheduled for June 29th with GYN Patient Active Problem List   Diagnosis Date Noted   GAD (generalized anxiety disorder) 09/01/2020   Encounter for screening fecal occult blood testing 01/01/2020   Encounter for well woman exam with routine gynecological exam 01/01/2020   Screening for colorectal cancer 07/11/2018   Encounter for gynecological examination with Papanicolaou smear of cervix 07/11/2018   Menopause present, declines hormone  replacement therapy 07/11/2018   Major depressive disorder, single episode, mild ([place]) 04/23/2018   Spotting 08/08/2016   Pelvic cramping 08/08/2016   Encounter for routine gynecological examination with Papanicolaou smear of cervix 08/08/2016   Mixed stress and urge urinary incontinence 08/08/2016   Vitamin D deficiency 02/03/2016   History of colonic polyps    Diverticulosis of colon without hemorrhage    Abdominal pain 09/30/2014   Bowel habit changes 09/21/2014   Rectal bleeding 09/21/2014   Hemorrhoids 09/21/2014   Hyperlipidemia 08/31/2014   Anxiety 07/07/2013   Hypertension 07/07/2013   Elevated cholesterol 07/07/2013     Current Outpatient Medications on File Prior to Visit  Medication Sig Dispense Refill   amLODipine (NORVASC) 5 MG tablet Take 1 tablet (5 mg total) by mouth daily. 30 tablet 4   BIOTIN PO Take 1 tablet by mouth daily.      cetirizine (ZYRTEC) 10 MG tablet Take 10 mg by mouth daily as needed for allergies.      clonazePAM (KLONOPIN) 0.5 MG tablet Take 0.5 mg by mouth daily as needed.     escitalopram (LEXAPRO) 20 MG tablet TAKE ONE TABLET ('20MG'$  TOTAL) BY MOUTH DAILY 30 tablet 5   fluticasone (FLONASE) 50 MCG/ACT nasal spray USE TWO SPRAYS IN BOTH NOSTRILS DAILY 16 g 2   indapamide (LOZOL) 2.5 MG tablet TAKE ONE TABLET (2.'5MG'$  TOTAL) BY MOUTH DAILY 30 tablet 0   losartan (COZAAR) 100 MG tablet TAKE ONE TABLET ('100MG'$  TOTAL) BY  MOUTH DAILY 30 tablet 5   mometasone (ELOCON) 0.1 % cream Apply 1 application topically daily as needed (Rash). 45 g 1   Multiple Vitamin (MULTIVITAMIN) tablet Take 1 tablet by mouth daily.     rosuvastatin (CRESTOR) 10 MG tablet TAKE ONE (1) TABLET BY MOUTH EVERY DAY 30 tablet 5   traZODone (DESYREL) 50 MG tablet Take 50 mg by mouth at bedtime.     No current facility-administered medications on file prior to visit.    Allergies  Allergen Reactions   Lisinopril Cough    Social History   Socioeconomic History   Marital status:  Divorced    Spouse name: Not on file   Number of children: Not on file   Years of education: Not on file   Highest education level: Not on file  Occupational History   Not on file  Tobacco Use   Smoking status: Never   Smokeless tobacco: Never  Vaping Use   Vaping Use: Never used  Substance and Sexual Activity   Alcohol use: Yes    Alcohol/week: 4.0 standard drinks of alcohol    Types: 4 Glasses of wine per week    Comment: occ wine   Drug use: No   Sexual activity: Yes    Birth control/protection: Surgical    Comment: tubal and ablation  Other Topics Concern   Not on file  Social History Narrative   Not on file   Social Determinants of Health   Financial Resource Strain: Medium Risk (01/01/2020)   Overall Financial Resource Strain (CARDIA)    Difficulty of Paying Living Expenses: Somewhat hard  Food Insecurity: No Food Insecurity (01/01/2020)   Hunger Vital Sign    Worried About Running Out of Food in the Last Year: Never true    Ran Out of Food in the Last Year: Never true  Transportation Needs: No Transportation Needs (01/01/2020)   PRAPARE - Hydrologist (Medical): No    Lack of Transportation (Non-Medical): No  Physical Activity: Insufficiently Active (01/01/2020)   Exercise Vital Sign    Days of Exercise per Week: 2 days    Minutes of Exercise per Session: 10 min  Stress: Stress Concern Present (01/01/2020)   [place]    Feeling of Stress : Very much  Social Connections: Moderately Isolated (01/01/2020)   Social Connection and Isolation Panel [NHANES]    Frequency of Communication with Friends and Family: More than three times a week    Frequency of Social Gatherings with Friends and Family: Once a week    Attends Religious Services: More than 4 times per year    Active Member of [employer] or Organizations: No    Attends Archivist Meetings: Never    Marital Status:  Divorced  Human resources officer Violence: Not At Risk (01/01/2020)   Humiliation, Afraid, Rape, and Kick questionnaire    Fear of Current or Ex-Partner: No    Emotionally Abused: No    Physically Abused: No    Sexually Abused: No    Family History  Problem Relation Age of Onset   Cancer Mother        lung   Hypertension Mother    Stroke Father    Heart attack Father    Congestive Heart Failure Father    Kidney failure Father    Colon polyps Father    Hypertension Sister    Drug abuse Sister    Diabetes Maternal  Uncle    Early death Paternal Aunt    Asthma Paternal Aunt    Cancer Maternal Grandmother        lung   Congestive Heart Failure Paternal Grandmother    Cancer Paternal Grandmother        pancreatic    Past Surgical History:  Procedure Laterality Date   COLONOSCOPY N/A 10/13/2014   Procedure: COLONOSCOPY;  Surgeon: Daneil Dolin, MD;  [facility];  Service: Endoscopy;  Laterality: N/A;  830    COLONOSCOPY N/A 12/02/2018   Procedure: COLONOSCOPY;  Surgeon: Daneil Dolin, MD;  Location: [facility];  Service: Endoscopy;  Laterality: N/A;  8:30am   ECTOPIC PREGNANCY SURGERY     ENDOMETRIAL ABLATION     POLYPECTOMY  12/02/2018   Procedure: POLYPECTOMY;  Surgeon: Daneil Dolin, MD;  Location: [facility];  Service: Endoscopy;;  colon   TUBAL LIGATION      ROS: Review of Systems Negative except as stated above  PHYSICAL EXAM: BP 120/81   Pulse 64   Temp 98 F (36.7 C) (Oral)   Ht '5\' 7"'$  (1.702 m)   Wt 202 lb 9.6 oz (91.9 kg)   SpO2 98%   BMI 31.73 kg/m   Wt Readings from Last 3 Encounters:  11/10/21 202 lb 9.6 oz (91.9 kg)  07/07/21 199 lb 3.2 oz (90.4 kg)  03/06/21 210 lb 6.4 oz (95.4 kg)    Physical Exam  General appearance - alert, well appearing, and in no distress Mental status - normal mood, behavior, speech, dress, motor activity, and thought processes Chest - clear to auscultation, no wheezes, rales or rhonchi, symmetric air  entry Heart - normal rate, regular rhythm, normal S1, S2, no murmurs, rubs, clicks or gallops Extremities - peripheral pulses normal, no pedal edema, no clubbing or cyanosis      Latest Ref Rng & Units 07/21/2021   11:02 AM 09/01/2020    3:00 PM 04/21/2018   11:10 AM  CMP  Glucose 70 - 99 mg/dL 94  81    BUN 6 - 24 mg/dL 13  11    Creatinine 0.57 - 1.00 mg/dL 0.88  0.84    Sodium 134 - 144 mmol/L 140  138    Potassium 3.5 - 5.2 mmol/L 4.2  4.4    Chloride 96 - 106 mmol/L 102  99    CO2 20 - 29 mmol/L 26  26    Calcium 8.7 - 10.2 mg/dL 10.2  10.0    Total Protein 6.0 - 8.5 g/dL 7.6  7.8  6.8   Total Bilirubin 0.0 - 1.2 mg/dL 0.3  0.2  <0.2   Alkaline Phos 44 - 121 IU/L 78  74  57   AST 0 - 40 IU/L '19  24  23   '$ ALT 0 - 32 IU/L 19  24  35    Lipid Panel     Component Value Date/Time   CHOL 194 07/21/2021 1102   TRIG 58 07/21/2021 1102   HDL 58 07/21/2021 1102   CHOLHDL 3.3 07/21/2021 1102   CHOLHDL 3.5 07/07/2013 0934   VLDL 11 07/07/2013 0934   LDLCALC 125 (H) 07/21/2021 1102    CBC    Component Value Date/Time   WBC 4.0 07/21/2021 1102   WBC 7.3 07/07/2013 0934   RBC 4.34 07/21/2021 1102   RBC 4.02 07/07/2013 0934   HGB 13.8 07/21/2021 1102   HCT 40.1 07/21/2021 1102   PLT 310 07/21/2021  1102   MCV 92 07/21/2021 1102   MCH 31.8 07/21/2021 1102   MCH 32.1 07/07/2013 0934   MCHC 34.4 07/21/2021 1102   MCHC 34.5 07/07/2013 0934   RDW 12.5 07/21/2021 1102    ASSESSMENT AND PLAN: 1. Essential hypertension Doing well on her current medications.  Continue amlodipine 5 mg daily, Cozaar 100 mg daily and lozol  2. Chest pain in adult Advised patient to give cardiology on-call to get her appointment scheduled based on the letter that they center..  3. Mixed hyperlipidemia Continue rosuvastatin.  4. Obesity (BMI 30-39.9) Discussed and encouraged meal planning to prevent having to resort to fast food  5. Fatigue, unspecified type 6. Loud snoring History concerning  for possible sleep apnea.  I recommend getting sleep study done.  Patient is agreeable to this. - Home sleep test    Patient was given the opportunity to ask questions.  Patient verbalized understanding of the plan and was able to repeat key elements of the plan.   This documentation was completed using Radio producer.  Any transcriptional errors are unintentional.  No orders of the defined types were placed in this encounter.    Requested Prescriptions    No prescriptions requested or ordered in this encounter    No follow-ups on file.  Karle Plumber, MD, FACP

## 2021-11-14 ENCOUNTER — Other Ambulatory Visit: Payer: Self-pay | Admitting: Internal Medicine

## 2021-11-14 DIAGNOSIS — I10 Essential (primary) hypertension: Secondary | ICD-10-CM

## 2021-11-23 ENCOUNTER — Ambulatory Visit: Payer: BC Managed Care – PPO | Admitting: Adult Health

## 2022-01-10 DIAGNOSIS — F419 Anxiety disorder, unspecified: Secondary | ICD-10-CM | POA: Diagnosis not present

## 2022-01-10 DIAGNOSIS — F331 Major depressive disorder, recurrent, moderate: Secondary | ICD-10-CM | POA: Diagnosis not present

## 2022-02-09 ENCOUNTER — Ambulatory Visit (INDEPENDENT_AMBULATORY_CARE_PROVIDER_SITE_OTHER): Payer: BC Managed Care – PPO | Admitting: Adult Health

## 2022-02-09 ENCOUNTER — Other Ambulatory Visit (HOSPITAL_COMMUNITY)
Admission: RE | Admit: 2022-02-09 | Discharge: 2022-02-09 | Disposition: A | Discharge status: Home / Self Care | Payer: BC Managed Care – PPO | Source: Ambulatory Visit | Attending: Adult Health | Admitting: Adult Health

## 2022-02-09 ENCOUNTER — Encounter: Payer: Self-pay | Admitting: Adult Health

## 2022-02-09 VITALS — BP 122/78 | HR 76 | Ht 67.0 in | Wt 205.5 lb

## 2022-02-09 DIAGNOSIS — Z01419 Encounter for gynecological examination (general) (routine) without abnormal findings: Secondary | ICD-10-CM | POA: Diagnosis not present

## 2022-02-09 DIAGNOSIS — Z780 Asymptomatic menopausal state: Secondary | ICD-10-CM | POA: Insufficient documentation

## 2022-02-09 DIAGNOSIS — R102 Pelvic and perineal pain: Secondary | ICD-10-CM | POA: Diagnosis not present

## 2022-02-09 DIAGNOSIS — Z1211 Encounter for screening for malignant neoplasm of colon: Secondary | ICD-10-CM

## 2022-02-09 LAB — HEMOCCULT GUIAC POC 1CARD (OFFICE): Fecal Occult Blood, POC: NEGATIVE

## 2022-02-09 NOTE — Progress Notes (Signed)
Patient ID: Shannon Ashley, female   DOB: 1965-10-10, 56 y.o.   MRN: [id number] History of Present Illness: Shannon Ashley is a 56 year old black female,divorced, PM in for a well woman gyn exam and pap. She is having some cramping like when had periods. She had ablation about 13 years ago. She is starting new job next week. PCP is Dr Wynetta Emery.  Current Medications, Allergies, Past Medical History, Past Surgical History, Family History and Social History were reviewed in [employer] record.     Review of Systems: Patient denies any headaches, hearing loss, fatigue, blurred vision, shortness of breath, chest pain, abdominal pain, problems with bowel movements, urination, or intercourse. No joint pain or mood swings.  Has pelvic cramping Denies any bleeding    Physical Exam:BP 122/78 (BP Location: Left Arm, Patient Position: Sitting, Cuff Size: Normal)   Pulse 76   Ht '5\' 7"'$  (1.702 m)   Wt 205 lb 8 oz (93.2 kg)   BMI 32.19 kg/m   General:  Well developed, well nourished, no acute distress Skin:  Warm and dry Neck:  Midline trachea, normal thyroid, good ROM, no lymphadenopathy Lungs; Clear to auscultation bilaterally Breast:  No dominant palpable mass, retraction, or nipple discharge Cardiovascular: Regular rate and rhythm Abdomen:  Soft, non tender, no hepatosplenomegaly Pelvic:  External genitalia is normal in appearance, no lesions.  The vagina is normal in appearance. Urethra has no lesions or masses. The cervix is bulbous.Pap with GC/CHL and HR HPV genotyping performed.  Uterus is felt to be normal shape, and contour,slightly enlarged.  No adnexal masses or tenderness noted.Bladder is non tender, no masses felt. Rectal: Good sphincter tone, no polyps, or hemorrhoids felt.  Hemoccult negative. Extremities/musculoskeletal:  No swelling or varicosities noted, no clubbing or cyanosis Psych:  No mood changes, alert and cooperative,seems happy AA is 3 Fall risk is low     02/09/2022    9:36 AM 11/10/2021    9:34 AM 07/07/2021    4:26 PM  Depression screen PHQ 2/9  Decreased Interest 0 0 1  Down, Depressed, Hopeless 0 0 1  PHQ - 2 Score 0 0 2  Altered sleeping 0 1 0  Tired, decreased energy '1 2 3  '$ Change in appetite 1 1 0  Feeling bad or failure about yourself  0 0 0  Trouble concentrating 1 0 0  Moving slowly or fidgety/restless 0 0 0  Suicidal thoughts 0 0 0  PHQ-9 Score '3 4 5   '$ She is on meds     02/09/2022    9:36 AM 11/10/2021    9:34 AM 07/07/2021    4:26 PM 04/05/2021   11:46 AM  GAD 7 : Generalized Anxiety Score  Nervous, Anxious, on Edge '1 1 1 3  '$ Control/stop worrying 0 0 0 1  Worry too much - different things 0 0 0 2  Trouble relaxing '1 1 1 2  '$ Restless 0 0 0 1  Easily annoyed or irritable 1 0 0 3  Afraid - awful might happen 0 0 0 1  Total GAD 7 Score '3 2 2 13    '$ Upstream - 02/09/22 0931       Pregnancy Intention Screening   Does the patient want to become pregnant in the next year? N/A    Does the patient's partner want to become pregnant in the next year? N/A    Would the patient like to discuss contraceptive options today? N/A      Contraception  Wrap Up   Current Method Female Sterilization    End Method Female Sterilization    Contraception Counseling Provided No            Examination chaperoned by Levy Pupa LPN    Impression and Plan: 1. Encounter for gynecological examination with Papanicolaou smear of cervix Pap sent Pap in 3 years if normal Physical in 1 year Labs with PCP Mammogram was negative 12/15/20 Colonoscopy advised, can talk with PCP - Cytology - PAP( [place])  2. Encounter for screening fecal occult blood testing Hemoccult was negative  - POCT occult blood stool  3. Postmenopause - US PELVIC COMPLETE WITH TRANSVAGINAL; Future  4. Pelvic cramping Will get pelvic US 02/16/22 at [facility] at 3:30 pm Will talk when results back - US PELVIC COMPLETE WITH TRANSVAGINAL; Future

## 2022-02-13 LAB — CYTOLOGY - PAP
Chlamydia: NEGATIVE
Comment: NEGATIVE
Comment: NEGATIVE
Comment: NORMAL
Diagnosis: NEGATIVE
High risk HPV: NEGATIVE
Neisseria Gonorrhea: NEGATIVE

## 2022-02-16 ENCOUNTER — Ambulatory Visit (HOSPITAL_COMMUNITY)
Admission: RE | Admit: 2022-02-16 | Discharge: 2022-02-16 | Disposition: A | Discharge status: Home / Self Care | Payer: BC Managed Care – PPO | Source: Ambulatory Visit | Attending: Adult Health | Admitting: Adult Health

## 2022-02-16 DIAGNOSIS — N950 Postmenopausal bleeding: Secondary | ICD-10-CM | POA: Diagnosis not present

## 2022-02-16 DIAGNOSIS — R102 Pelvic and perineal pain: Secondary | ICD-10-CM | POA: Insufficient documentation

## 2022-02-16 DIAGNOSIS — D259 Leiomyoma of uterus, unspecified: Secondary | ICD-10-CM | POA: Diagnosis not present

## 2022-02-16 DIAGNOSIS — Z780 Asymptomatic menopausal state: Secondary | ICD-10-CM

## 2022-03-13 ENCOUNTER — Ambulatory Visit: Payer: BC Managed Care – PPO | Admitting: Internal Medicine

## 2022-03-14 IMAGING — MG DIGITAL SCREENING BILAT W/ CAD
4 series · 4 of 4 positions shown · non-contrast
Comparison: Previous exam(s).

CLINICAL DATA: Screening.

EXAM:
DIGITAL SCREENING BILATERAL MAMMOGRAM WITH CAD
TECHNIQUE: Bilateral screening digital craniocaudal and mediolateral oblique
mammograms were obtained. The images were evaluated with
computer-aided detection.

[R CC]
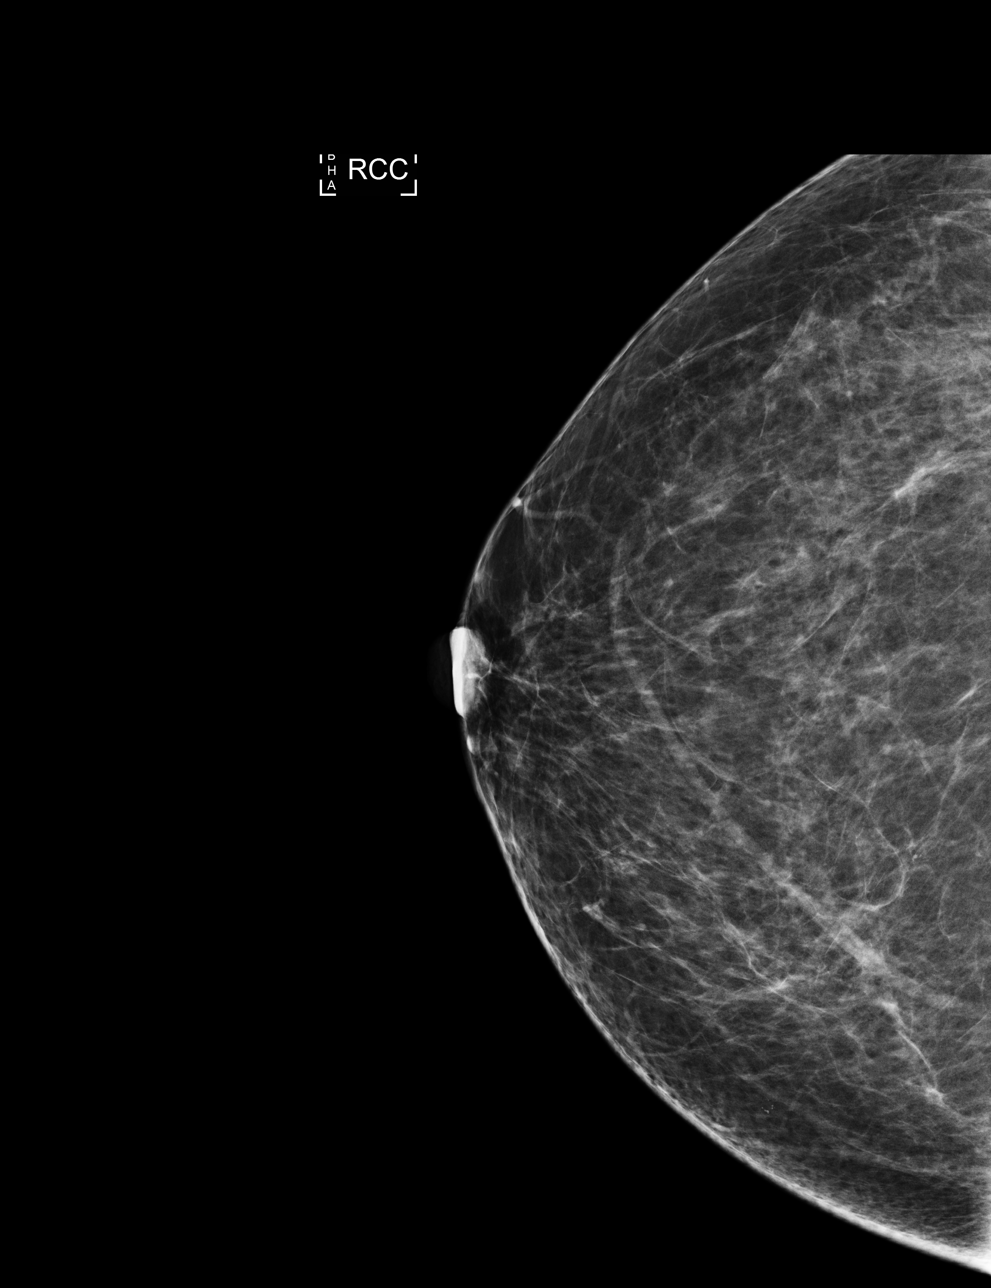

[R MLO]
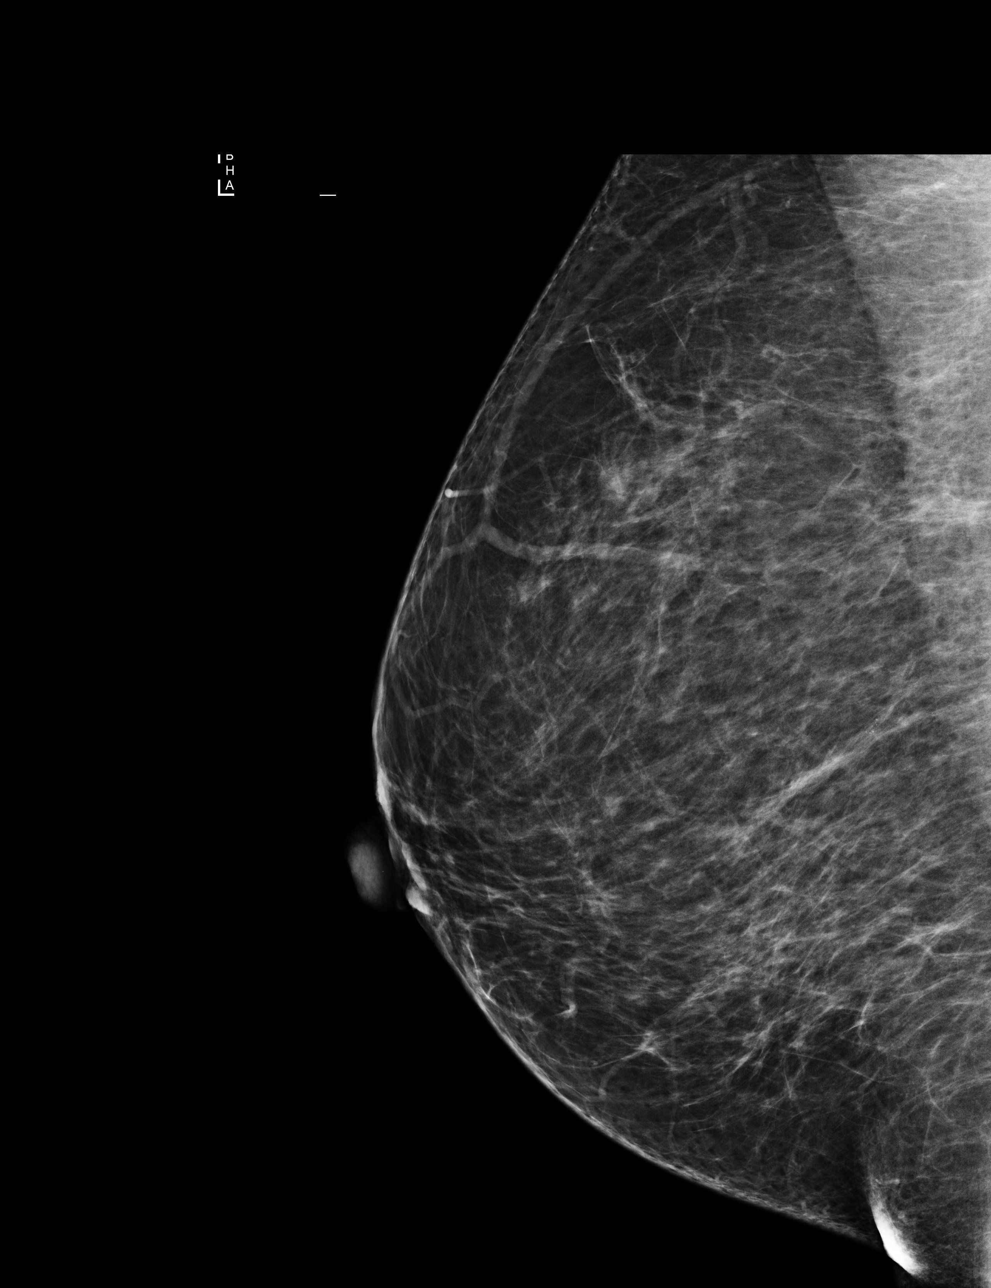

[L CC]
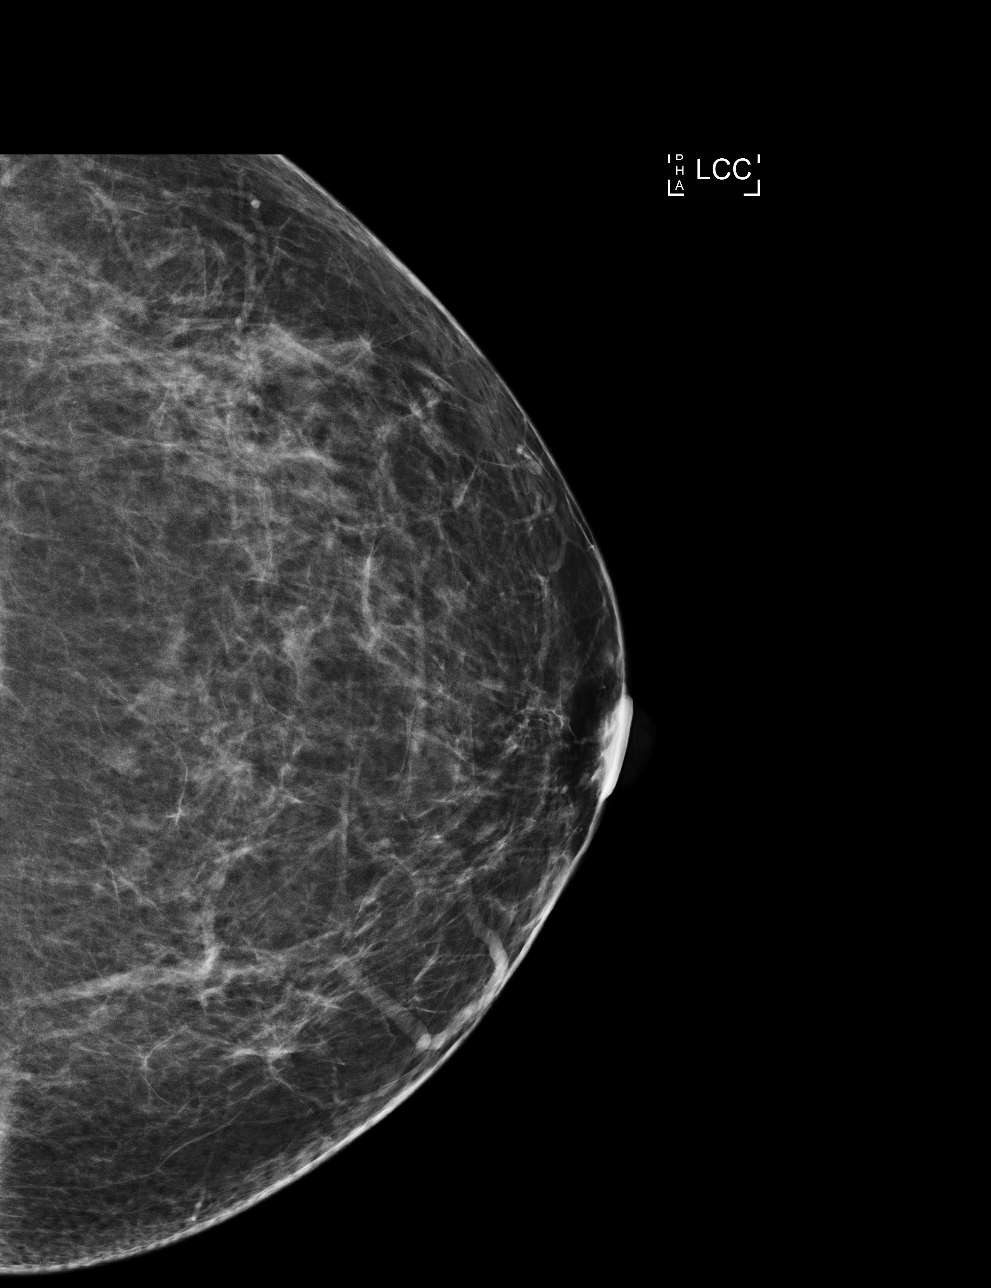

[L MLO]
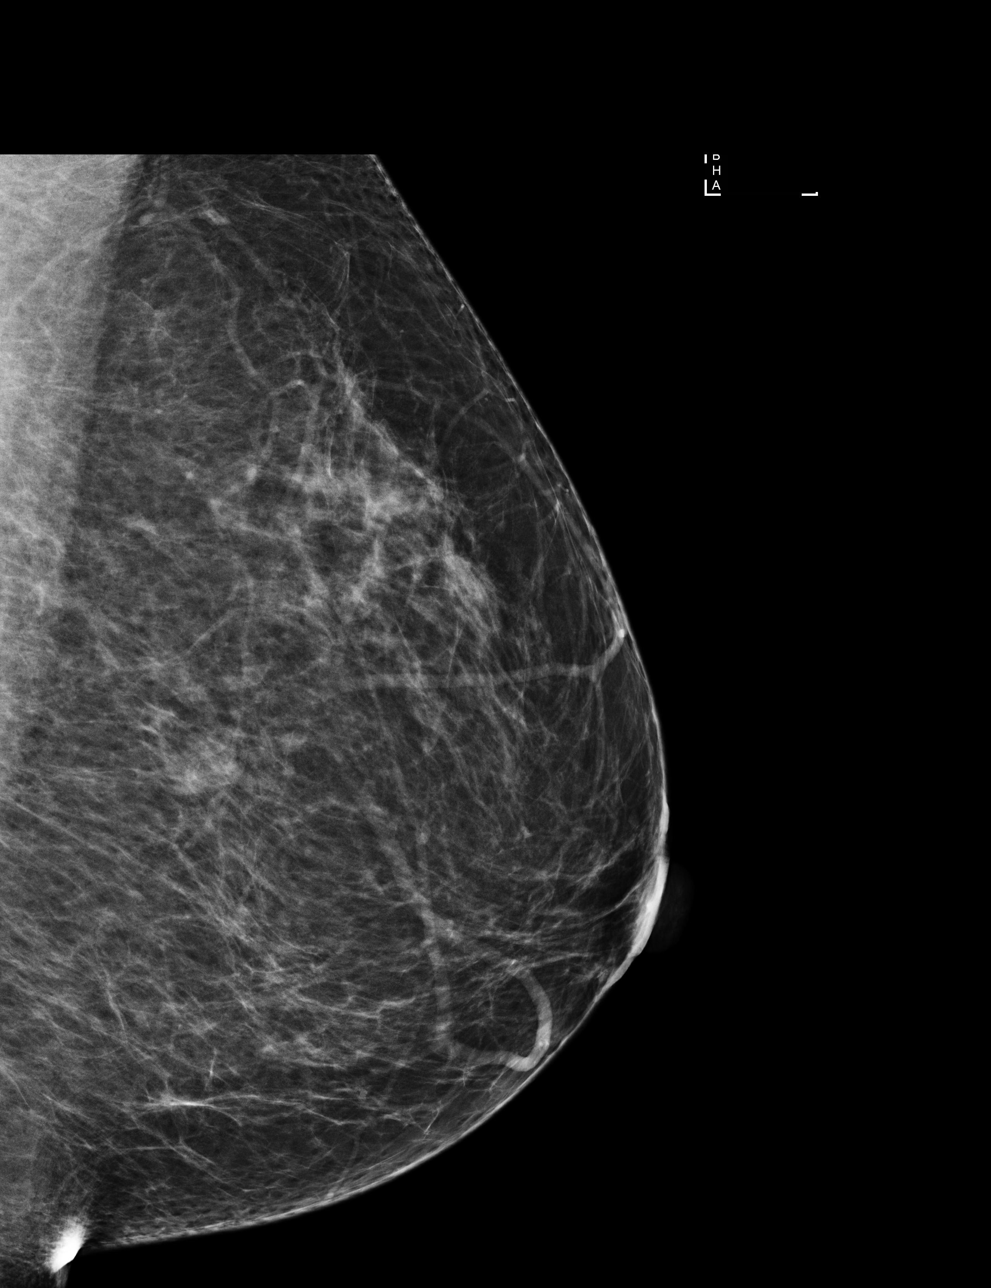

[4 of 4 positions shown; findings below may reference images not displayed]

ACR Breast Density Category b: There are scattered areas of
fibroglandular density.
FINDINGS: There are no findings suspicious for malignancy.
IMPRESSION: No mammographic evidence of malignancy. A result letter of this
screening mammogram will be mailed directly to the patient.

RECOMMENDATION:
Screening mammogram in one year. (Code:[id number]-V-ZRK)

BI-RADS CATEGORY  1: Negative.

## 2022-05-15 ENCOUNTER — Ambulatory Visit: Payer: Self-pay

## 2022-05-15 ENCOUNTER — Telehealth: Payer: BC Managed Care – PPO | Admitting: Nurse Practitioner

## 2022-05-15 DIAGNOSIS — U071 COVID-19: Secondary | ICD-10-CM

## 2022-05-15 MED ORDER — MOLNUPIRAVIR EUA 200MG CAPSULE: 4.0000 | ORAL_CAPSULE | Freq: Two times a day (BID) | ORAL | 0 refills | Status: AC

## 2022-05-15 NOTE — Progress Notes (Signed)
Virtual Visit Consent   Shannon Ashley, you are scheduled for a virtual visit with a [facility] provider today. Just as with appointments in the office, your consent must be obtained to participate. Your consent will be active for this visit and any virtual visit you may have with one of our providers in the next 365 days. If you have a MyChart account, a copy of this consent can be sent to you electronically.  As this is a virtual visit, video technology does not allow for your provider to perform a traditional examination. This may limit your provider's ability to fully assess your condition. If your provider identifies any concerns that need to be evaluated in person or the need to arrange testing (such as labs, EKG, etc.), we will make arrangements to do so. Although advances in technology are sophisticated, we cannot ensure that it will always work on either your end or our end. If the connection with a video visit is poor, the visit may have to be switched to a telephone visit. With either a video or telephone visit, we are not always able to ensure that we have a secure connection.  By engaging in this virtual visit, you consent to the provision of healthcare and authorize for your insurance to be billed (if applicable) for the services provided during this visit. Depending on your insurance coverage, you may receive a charge related to this service.  I need to obtain your verbal consent now. Are you willing to proceed with your visit today? Shannon Ashley has provided verbal consent on 05/15/2022 for a virtual visit (video or telephone). [name], FNP  Date: 05/15/2022 11:27 AM  Virtual Visit via Video Note   I, [name], connected with  Shannon Ashley  ([id number], August 27, 1965) on 05/15/22 at 11:30 AM EST by a video-enabled telemedicine application and verified that I am speaking with the correct person using two identifiers.  Location: Patient: Virtual Visit Location Patient:  Home Provider: Virtual Visit Location Provider: Home Office   I discussed the limitations of evaluation and management by telemedicine and the availability of in person appointments. The patient expressed understanding and agreed to proceed.    History of Present Illness: Shannon Ashley is a 56 y.o. who identifies as a female who was assigned female at birth, and is being seen today for COVID.  She has been feeling sick for the past 4 days Started with feeling run down, took Zycam  Two nights ago she started to have body aches and a cough that has become productive   She had a fever yesterday morning for the first time  She has also been using ibuprofen for relief as well as Robitussin DM  Tested for COVID at home (took two tests) that were positive   She has had COVID in the past without complications  She has been vaccinated 3-4x for COVID as well   She had asthma as a child denies a history of bronchitis or pneumonia    Problems:  Patient Active Problem List   Diagnosis Date Noted   Postmenopause 02/09/2022   GAD (generalized anxiety disorder) 09/01/2020   Encounter for screening fecal occult blood testing 01/01/2020   Encounter for well woman exam with routine gynecological exam 01/01/2020   Screening for colorectal cancer 07/11/2018   Encounter for gynecological examination with Papanicolaou smear of cervix 07/11/2018   Menopause present, declines hormone replacement therapy 07/11/2018   Major depressive disorder, single episode, mild ([place]) 04/23/2018  Spotting 08/08/2016   Pelvic cramping 08/08/2016   Encounter for routine gynecological examination with Papanicolaou smear of cervix 08/08/2016   Mixed stress and urge urinary incontinence 08/08/2016   Vitamin D deficiency 02/03/2016   History of colonic polyps    Diverticulosis of colon without hemorrhage    Abdominal pain 09/30/2014   Bowel habit changes 09/21/2014   Rectal bleeding 09/21/2014   Hemorrhoids  09/21/2014   Hyperlipidemia 08/31/2014   Anxiety 07/07/2013   Hypertension 07/07/2013   Elevated cholesterol 07/07/2013    Allergies:  Allergies  Allergen Reactions   Lisinopril Cough   Medications:  Current Outpatient Medications:    amLODipine (NORVASC) 5 MG tablet, TAKE ONE TABLET ('5MG'$  TOTAL) BY MOUTH DAILY, Disp: 90 tablet, Rfl: 1   cetirizine (ZYRTEC) 10 MG tablet, Take 10 mg by mouth daily as needed for allergies. , Disp: , Rfl:    clonazePAM (KLONOPIN) 0.5 MG tablet, Take 0.5 mg by mouth daily as needed., Disp: , Rfl:    escitalopram (LEXAPRO) 20 MG tablet, TAKE ONE TABLET ('20MG'$  TOTAL) BY MOUTH DAILY, Disp: 30 tablet, Rfl: 5   fluticasone (FLONASE) 50 MCG/ACT nasal spray, USE TWO SPRAYS IN BOTH NOSTRILS DAILY, Disp: 16 g, Rfl: 2   indapamide (LOZOL) 2.5 MG tablet, TAKE ONE TABLET (2.'5MG'$  TOTAL) BY MOUTH DAILY, Disp: 90 tablet, Rfl: 1   losartan (COZAAR) 100 MG tablet, TAKE ONE TABLET ('100MG'$  TOTAL) BY MOUTH DAILY, Disp: 30 tablet, Rfl: 5   mometasone (ELOCON) 0.1 % cream, Apply 1 application topically daily as needed (Rash)., Disp: 45 g, Rfl: 1   Multiple Vitamin (MULTIVITAMIN) tablet, Take 1 tablet by mouth daily., Disp: , Rfl:    rosuvastatin (CRESTOR) 10 MG tablet, TAKE ONE (1) TABLET BY MOUTH EVERY DAY, Disp: 30 tablet, Rfl: 5   traZODone (DESYREL) 50 MG tablet, Take 50 mg by mouth at bedtime., Disp: , Rfl:   Observations/Objective: Patient is well-developed, well-nourished in no acute distress.  Resting comfortably  at home.  Head is normocephalic, atraumatic.  No labored breathing.  Speech is clear and coherent with logical content.  Patient is alert and oriented at baseline.    Assessment and Plan: 1. COVID-19 Continue to alternate ibuprofen and tylenol and switch to Mucinex OTC for cough  - molnupiravir EUA (LAGEVRIO) 200 mg CAPS capsule; Take 4 capsules (800 mg total) by mouth 2 (two) times daily for 5 days.  Dispense: 40 capsule; Refill: 0     Follow Up  Instructions: I discussed the assessment and treatment plan with the patient. The patient was provided an opportunity to ask questions and all were answered. The patient agreed with the plan and demonstrated an understanding of the instructions.  A copy of instructions were sent to the patient via MyChart unless otherwise noted below.    The patient was advised to call back or seek an in-person evaluation if the symptoms worsen or if the condition fails to improve as anticipated.  Time:  I spent 10 minutes with the patient via telehealth technology discussing the above problems/concerns.    [name], FNP

## 2022-05-15 NOTE — Telephone Encounter (Signed)
Message from Shan Levans sent at 05/15/2022  9:09 AM EST  Summary: Covid+   Patient states that she had a positive home Covid test yesterday. Patient states she has been experiencing a bad headache, congestion and cough. No appointments available.         Chief Complaint: headache, cough Symptoms: coughing up yellow phlegm and nose is draining yellow mucous, fever, muscle aches and chest and and pain with coughing Frequency: Sat Pertinent Negatives: Patient denies SOB, vomiting or diarrhea Disposition: []ED /[]Urgent Care (no appt availability in office) / []Appointment(In office/virtual)/ [x] [place] [facility]/ []Home Care/ []Refused Recommended Disposition /[][facility]/ [] Follow-up with PCP Additional Notes: no openings in clinic for appt  Reason for Disposition  [1] COVID-19 diagnosed by positive lab test (e.g., PCR, rapid self-test kit) AND [2] mild symptoms (e.g., cough, fever, others) AND [4] no complications or SOB  Answer Assessment - Initial Assessment Questions 1. COVID-19 DIAGNOSIS: "How do you know that you have COVID?" (e.g., positive lab test or self-test, diagnosed by doctor or NP/PA, symptoms after exposure).     Self tested x 2  2. COVID-19 EXPOSURE: "Was there any known exposure to COVID before the symptoms began?" CDC Definition of close contact: within 6 feet (2 meters) for a total of 15 minutes or more over a 24-hour period.      no 3. ONSET: "When did the COVID-19 symptoms start?"      Sunday 4. WORST SYMPTOM: "What is your worst symptom?" (e.g., cough, fever, shortness of breath, muscle aches)     Headache, cough 5. COUGH: "Do you have a cough?" If Yes, ask: "How bad is the cough?"       Yes- phlegm is yellow 6. FEVER: "Do you have a fever?" If Yes, ask: "What is your temperature, how was it measured, and when did it start?"     Fever yesterday 7. RESPIRATORY STATUS: "Describe your breathing?" (e.g., normal; shortness of breath, wheezing,  unable to speak)      normal 8. BETTER-SAME-WORSE: "Are you getting better, staying the same or getting worse compared to yesterday?"  If getting worse, ask, "In what way?"     same 9. OTHER SYMPTOMS: "Do you have any other symptoms?"  (e.g., chills, fatigue, headache, loss of smell or taste, muscle pain, sore throat)     Headache, muscle soreness, nasal congestion with yellow mucous,  10. HIGH RISK DISEASE: "Do you have any chronic medical problems?" (e.g., asthma, heart or lung disease, weak immune system, obesity, etc.)       HTN 11. VACCINE: "Have you had the COVID-19 vaccine?" If Yes, ask: "Which one, how many shots, when did you get it?"       3 12. PREGNANCY: "Is there any chance you are pregnant?" "When was your last menstrual period?"       N/a 13. O2 SATURATION MONITOR:  "Do you use an oxygen saturation monitor (pulse oximeter) at home?" If Yes, ask "What is your reading (oxygen level) today?" "What is your usual oxygen saturation reading?" (e.g., 95%)       N/a  Protocols used: Coronavirus (COVID-19) Diagnosed or Suspected-A-AH

## 2022-06-05 ENCOUNTER — Telehealth: Payer: Self-pay | Admitting: Internal Medicine

## 2022-06-05 NOTE — Telephone Encounter (Signed)
Referral Request - Has patient seen PCP for this complaint? yes *If NO, is insurance requiring patient see PCP for this issue before PCP can refer them? Referral for which specialty: cardiologist/ previous refferal has been expunged couldn't reach patient Preferred provider/office: ? Reason for referral: chest pain

## 2022-08-07 ENCOUNTER — Other Ambulatory Visit: Payer: Self-pay | Admitting: Internal Medicine

## 2022-08-07 DIAGNOSIS — I10 Essential (primary) hypertension: Secondary | ICD-10-CM

## 2022-08-07 NOTE — Telephone Encounter (Signed)
Requested Prescriptions  Pending Prescriptions Disp Refills   rosuvastatin (CRESTOR) 10 MG tablet [Pharmacy Med Name: ROSUVASTATIN CALCIUM 10 MG TAB] 90 tablet 0    Sig: TAKE ONE (1) TABLET BY MOUTH EVERY DAY     Cardiovascular:  Antilipid - Statins 2 Failed - 08/07/2022 10:53 AM      Failed - Cr in normal range and within 360 days    Creat  Date Value Ref Range Status  10/12/2014 0.80 0.50 - 1.10 mg/dL Final   Creatinine, Ser  Date Value Ref Range Status  07/21/2021 0.88 0.57 - 1.00 mg/dL Final         Failed - Lipid Panel in normal range within the last 12 months    Cholesterol, Total  Date Value Ref Range Status  07/21/2021 194 100 - 199 mg/dL Final   LDL Chol Calc (NIH)  Date Value Ref Range Status  07/21/2021 125 (H) 0 - 99 mg/dL Final   HDL  Date Value Ref Range Status  07/21/2021 58 >39 mg/dL Final   Triglycerides  Date Value Ref Range Status  07/21/2021 58 0 - 149 mg/dL Final         Passed - Patient is not pregnant      Passed - Valid encounter within last 12 months    Recent Outpatient Visits           9 months ago Essential hypertension   [place], Deborah B, MD   1 year ago Essential hypertension   [place], Deborah B, MD   1 year ago Essential hypertension   [place], Deborah B, MD   1 year ago Essential hypertension   [place] Ladell Pier, MD   1 year ago Establishing care with new doctor, encounter for   [place], MD       Future Appointments             In 1 week Ladell Pier, MD [place]             indapamide (LOZOL) 2.5 MG tablet [Pharmacy Med Name: INDAPAMIDE 2.5 MG TAB] 90 tablet 0    Sig: TAKE ONE TABLET (2.'5MG'$  TOTAL) BY MOUTH DAILY      Cardiovascular: Diuretics - Thiazide Failed - 08/07/2022 10:53 AM      Failed - Cr in normal range and within 180 days    Creat  Date Value Ref Range Status  10/12/2014 0.80 0.50 - 1.10 mg/dL Final   Creatinine, Ser  Date Value Ref Range Status  07/21/2021 0.88 0.57 - 1.00 mg/dL Final         Failed - K in normal range and within 180 days    Potassium  Date Value Ref Range Status  07/21/2021 4.2 3.5 - 5.2 mmol/L Final         Failed - Na in normal range and within 180 days    Sodium  Date Value Ref Range Status  07/21/2021 140 134 - 144 mmol/L Final         Failed - Valid encounter within last 6 months    Recent Outpatient Visits           9 months ago Essential hypertension   [place],  MD   1 year ago Essential hypertension   [place], MD   1 year ago Essential hypertension   [place], MD   1 year ago Essential hypertension   [place] Ladell Pier, MD   1 year ago Establishing care with new doctor, encounter for   [place], MD       Future Appointments             In 1 week Ladell Pier, MD [place] BP in normal range    BP Readings from Last 1 Encounters:  02/09/22 122/78          amLODipine (NORVASC) 5 MG tablet [Pharmacy Med Name: AMLODIPINE BESYLATE 5 MG TAB] 90 tablet 0    Sig: TAKE ONE TABLET ('5MG'$  TOTAL) BY MOUTH DAILY     Cardiovascular: Calcium Channel Blockers 2 Failed - 08/07/2022 10:53 AM      Failed - Valid encounter within last 6 months    Recent Outpatient Visits           9 months ago Essential hypertension   [place], Deborah B, MD   1 year ago Essential  hypertension   [place], Deborah B, MD   1 year ago Essential hypertension   [place], Deborah B, MD   1 year ago Essential hypertension   [place] Karle Plumber B, MD   1 year ago Establishing care with new doctor, encounter for   [place], MD       Future Appointments             In 1 week Ladell Pier, MD [place] BP in normal range    BP Readings from Last 1 Encounters:  02/09/22 122/78         Passed - Last Heart Rate in normal range    Pulse Readings from Last 1 Encounters:  02/09/22 76          losartan (COZAAR) 100 MG tablet [Pharmacy Med Name: LOSARTAN POTASSIUM 100 MG TAB] 90 tablet 0    Sig: TAKE ONE TABLET ('100MG'$  TOTAL) BY MOUTH DAILY     Cardiovascular:  Angiotensin Receptor Blockers Failed - 08/07/2022 10:53 AM      Failed - Cr in normal range and within 180 days    Creat  Date Value Ref Range Status  10/12/2014 0.80 0.50 - 1.10 mg/dL Final   Creatinine, Ser  Date Value Ref Range Status  07/21/2021 0.88 0.57 - 1.00 mg/dL Final         Failed - K in normal range and within 180 days    Potassium  Date Value Ref Range Status  07/21/2021 4.2 3.5 - 5.2 mmol/L Final         Failed - Valid encounter within last 6 months    Recent Outpatient Visits           9 months ago Essential  hypertension   [place] Ladell Pier, MD   1 year ago Essential hypertension   [place], MD   1 year ago Essential hypertension   [place], MD   1 year ago Essential hypertension   [place] Ladell Pier, MD   1 year ago  Establishing care with new doctor, encounter for   [place], MD       Future Appointments             In 1 week Ladell Pier, MD [place] - Patient is not pregnant      Passed - Last BP in normal range    BP Readings from Last 1 Encounters:  02/09/22 122/78

## 2022-08-14 ENCOUNTER — Ambulatory Visit: Payer: Self-pay | Admitting: Internal Medicine

## 2022-08-18 ENCOUNTER — Ambulatory Visit: Payer: Self-pay | Admitting: Family

## 2022-09-19 DIAGNOSIS — F418 Other specified anxiety disorders: Secondary | ICD-10-CM | POA: Diagnosis not present

## 2022-09-19 DIAGNOSIS — F4322 Adjustment disorder with anxiety: Secondary | ICD-10-CM | POA: Diagnosis not present

## 2022-09-19 DIAGNOSIS — F331 Major depressive disorder, recurrent, moderate: Secondary | ICD-10-CM | POA: Diagnosis not present

## 2022-10-04 ENCOUNTER — Ambulatory Visit: Payer: BC Managed Care – PPO | Attending: Internal Medicine | Admitting: Internal Medicine

## 2022-10-04 ENCOUNTER — Encounter: Payer: Self-pay | Admitting: Internal Medicine

## 2022-10-04 VITALS — BP 106/72 | HR 73 | Temp 98.2°F | Ht 67.0 in | Wt 210.0 lb

## 2022-10-04 DIAGNOSIS — Z6830 Body mass index (BMI) 30.0-30.9, adult: Secondary | ICD-10-CM

## 2022-10-04 DIAGNOSIS — F330 Major depressive disorder, recurrent, mild: Secondary | ICD-10-CM

## 2022-10-04 DIAGNOSIS — E782 Mixed hyperlipidemia: Secondary | ICD-10-CM | POA: Diagnosis not present

## 2022-10-04 DIAGNOSIS — E669 Obesity, unspecified: Secondary | ICD-10-CM | POA: Diagnosis not present

## 2022-10-04 DIAGNOSIS — I10 Essential (primary) hypertension: Secondary | ICD-10-CM

## 2022-10-04 MED ORDER — AMLODIPINE BESYLATE 5 MG PO TABS: ORAL_TABLET | ORAL | 1 refills | Status: DC

## 2022-10-04 MED ORDER — LOSARTAN POTASSIUM 100 MG PO TABS: ORAL_TABLET | ORAL | 1 refills | Status: DC

## 2022-10-04 MED ORDER — ROSUVASTATIN CALCIUM 10 MG PO TABS: ORAL_TABLET | ORAL | 1 refills | Status: DC

## 2022-10-04 NOTE — Progress Notes (Signed)
Patient ID: Shannon Ashley, female    DOB: 1965-12-22  MRN: [id number]  CC: Hypertension (HTN f/u. Med refill  - indapemide/Concerns about fluctuating weight /Concern about swollen ankles )   Subjective: Shannon Ashley is a 57 y.o. female who presents for chronic ds management Her concerns today include:  Patient with history of HL, HTN, anxiety disorder, MDD.    HTN:  compliant with meds that includes Cozaar 100 mg daily, Norvasc 5 mg daily and Lozol 2.5 mg.  Needs RF. Limits salt in foods.  Reading labels more Wonders about fluid buildup around ankle as she sees a circle around her ankles.  Concern about wgh. Fluctuates by 5 lbs sometimes within a day.  Exercises but not consistent.  Stress eats but most of the time she has been doing good and making better food choices.  Eating smaller meals and carries salad for lunch. Eating more fish like Salmon.   HL:  taking and tolerating Crestor.  Last LDL 125  Dep/Anx:  Still followed by Dr. Andrena Mews on trazodone, clonazepam, and Lexapro.  Feels she is doing much better   Patient Active Problem List   Diagnosis Date Noted   Postmenopause 02/09/2022   GAD (generalized anxiety disorder) 09/01/2020   Encounter for screening fecal occult blood testing 01/01/2020   Encounter for well woman exam with routine gynecological exam 01/01/2020   Screening for colorectal cancer 07/11/2018   Encounter for gynecological examination with Papanicolaou smear of cervix 07/11/2018   Menopause present, declines hormone replacement therapy 07/11/2018   Major depressive disorder, single episode, mild (HCC) 04/23/2018   Spotting 08/08/2016   Pelvic cramping 08/08/2016   Encounter for routine gynecological examination with Papanicolaou smear of cervix 08/08/2016   Mixed stress and urge urinary incontinence 08/08/2016   Vitamin D deficiency 02/03/2016   History of colonic polyps    Diverticulosis of colon without hemorrhage    Abdominal pain  09/30/2014   Bowel habit changes 09/21/2014   Rectal bleeding 09/21/2014   Hemorrhoids 09/21/2014   Hyperlipidemia 08/31/2014   Anxiety 07/07/2013   Hypertension 07/07/2013   Elevated cholesterol 07/07/2013     Current Outpatient Medications on File Prior to Visit  Medication Sig Dispense Refill   cetirizine (ZYRTEC) 10 MG tablet Take 10 mg by mouth daily as needed for allergies.      clonazePAM (KLONOPIN) 0.5 MG tablet Take 0.5 mg by mouth daily as needed.     escitalopram (LEXAPRO) 20 MG tablet TAKE ONE TABLET (20MG  TOTAL) BY MOUTH DAILY 30 tablet 5   fluticasone (FLONASE) 50 MCG/ACT nasal spray USE TWO SPRAYS IN BOTH NOSTRILS DAILY 16 g 2   indapamide (LOZOL) 2.5 MG tablet TAKE ONE TABLET (2.5MG  TOTAL) BY MOUTH DAILY 90 tablet 0   mometasone (ELOCON) 0.1 % cream Apply 1 application topically daily as needed (Rash). 45 g 1   Multiple Vitamin (MULTIVITAMIN) tablet Take 1 tablet by mouth daily.     traZODone (DESYREL) 50 MG tablet Take 50 mg by mouth at bedtime.     No current facility-administered medications on file prior to visit.    Allergies  Allergen Reactions   Lisinopril Cough    Social History   Socioeconomic History   Marital status: Divorced    Spouse name: Not on file   Number of children: Not on file   Years of education: Not on file   Highest education level: Not on file  Occupational History   Not on file  Tobacco Use  Smoking status: Never   Smokeless tobacco: Never  Vaping Use   Vaping Use: Never used  Substance and Sexual Activity   Alcohol use: Yes    Alcohol/week: 4.0 standard drinks of alcohol    Types: 4 Glasses of wine per week    Comment: occ wine   Drug use: No   Sexual activity: Yes    Birth control/protection: Surgical    Comment: tubal and ablation  Other Topics Concern   Not on file  Social History Narrative   Not on file   Social Determinants of Health   Financial Resource Strain: Medium Risk (02/09/2022)   Overall Financial  Resource Strain (CARDIA)    Difficulty of Paying Living Expenses: Somewhat hard  Food Insecurity: Food Insecurity Present (02/09/2022)   Hunger Vital Sign    Worried About Running Out of Food in the Last Year: Sometimes true    Ran Out of Food in the Last Year: Sometimes true  Transportation Needs: No Transportation Needs (02/09/2022)   PRAPARE - Administrator, Civil Service (Medical): No    Lack of Transportation (Non-Medical): No  Physical Activity: Insufficiently Active (02/09/2022)   Exercise Vital Sign    Days of Exercise per Week: 2 days    Minutes of Exercise per Session: 20 min  Stress: Stress Concern Present (02/09/2022)   [employer] of Occupational Health - Occupational Stress Questionnaire    Feeling of Stress : To some extent  Social Connections: Moderately Isolated (02/09/2022)   Social Connection and Isolation Panel [NHANES]    Frequency of Communication with Friends and Family: More than three times a week    Frequency of Social Gatherings with Friends and Family: Once a week    Attends Religious Services: More than 4 times per year    Active Member of [employer] or Organizations: No    Attends Banker Meetings: Never    Marital Status: Divorced  Catering manager Violence: Not At Risk (02/09/2022)   Humiliation, Afraid, Rape, and Kick questionnaire    Fear of Current or Ex-Partner: No    Emotionally Abused: No    Physically Abused: No    Sexually Abused: No    Family History  Problem Relation Age of Onset   Cancer Mother        lung   Hypertension Mother    Stroke Father    Heart attack Father    Congestive Heart Failure Father    Kidney failure Father    Colon polyps Father    Hypertension Sister    Drug abuse Sister    Diabetes Maternal Uncle    Early death Paternal Aunt    Asthma Paternal Aunt    Cancer Maternal Grandmother        lung   Congestive Heart Failure Paternal Grandmother    Cancer Paternal Grandmother         pancreatic    Past Surgical History:  Procedure Laterality Date   COLONOSCOPY N/A 10/13/2014   Procedure: COLONOSCOPY;  Surgeon: Corbin Ade, MD;  [facility];  Service: Endoscopy;  Laterality: N/A;  830    COLONOSCOPY N/A 12/02/2018   Procedure: COLONOSCOPY;  Surgeon: Corbin Ade, MD;  [facility]: [facility];  Service: Endoscopy;  Laterality: N/A;  8:30am   ECTOPIC PREGNANCY SURGERY     ENDOMETRIAL ABLATION     POLYPECTOMY  12/02/2018   Procedure: POLYPECTOMY;  Surgeon: Corbin Ade, MD;  Location: [facility];  Service:  Endoscopy;;  colon   TUBAL LIGATION      ROS: Review of Systems Negative except as stated above  PHYSICAL EXAM: BP 106/72 (BP Location: Left Arm, Patient Position: Sitting, Cuff Size: Large)   Pulse 73   Temp 98.2 F (36.8 C) (Oral)   Ht 5\' 7"  (1.702 m)   Wt 210 lb (95.3 kg)   SpO2 99%   BMI 32.89 kg/m   Wt Readings from Last 3 Encounters:  10/04/22 210 lb (95.3 kg)  02/09/22 205 lb 8 oz (93.2 kg)  11/10/21 202 lb 9.6 oz (91.9 kg)    Physical Exam  General appearance - alert, well appearing, middle-aged African-American female and in no distress Neck - supple, no significant adenopathy Chest - clear to auscultation, no wheezes, rales or rhonchi, symmetric air entry Heart - normal rate, regular rhythm, normal S1, S2, no murmurs, rubs, clicks or gallops Extremities - peripheral pulses normal, no pedal edema, no clubbing or cyanosis     10/04/2022    2:00 PM 02/09/2022    9:36 AM 11/10/2021    9:34 AM  Depression screen PHQ 2/9  Decreased Interest 1 0 0  Down, Depressed, Hopeless 1 0 0  PHQ - 2 Score 2 0 0  Altered sleeping 0 0 1  Tired, decreased energy 2 1 2   Change in appetite 0 1 1  Feeling bad or failure about yourself  0 0 0  Trouble concentrating 1 1 0  Moving slowly or fidgety/restless 0 0 0  Suicidal thoughts 0 0 0  PHQ-9 Score 5 3 4        Latest Ref Rng & Units 07/21/2021   11:02 AM 09/01/2020    3:00 PM  04/21/2018   11:10 AM  CMP  Glucose 70 - 99 mg/dL 94  81    BUN 6 - 24 mg/dL 13  11    Creatinine 1.61 - 1.00 mg/dL 0.96  0.45    Sodium 409 - 144 mmol/L 140  138    Potassium 3.5 - 5.2 mmol/L 4.2  4.4    Chloride 96 - 106 mmol/L 102  99    CO2 20 - 29 mmol/L 26  26    Calcium 8.7 - 10.2 mg/dL 81.1  91.4    Total Protein 6.0 - 8.5 g/dL 7.6  7.8  6.8   Total Bilirubin 0.0 - 1.2 mg/dL 0.3  0.2  <7.8   Alkaline Phos 44 - 121 IU/L 78  74  57   AST 0 - 40 IU/L 19  24  23    ALT 0 - 32 IU/L 19  24  35    Lipid Panel     Component Value Date/Time   CHOL 194 07/21/2021 1102   TRIG 58 07/21/2021 1102   HDL 58 07/21/2021 1102   CHOLHDL 3.3 07/21/2021 1102   CHOLHDL 3.5 07/07/2013 0934   VLDL 11 07/07/2013 0934   LDLCALC 125 (H) 07/21/2021 1102    CBC    Component Value Date/Time   WBC 4.0 07/21/2021 1102   WBC 7.3 07/07/2013 0934   RBC 4.34 07/21/2021 1102   RBC 4.02 07/07/2013 0934   HGB 13.8 07/21/2021 1102   HCT 40.1 07/21/2021 1102   PLT 310 07/21/2021 1102   MCV 92 07/21/2021 1102   MCH 31.8 07/21/2021 1102   MCH 32.1 07/07/2013 0934   MCHC 34.4 07/21/2021 1102   MCHC 34.5 07/07/2013 0934   RDW 12.5 07/21/2021 1102    ASSESSMENT AND PLAN:  1. Essential hypertension At goal.  Continue medications listed above - amLODipine (NORVASC) 5 MG tablet; TAKE ONE TABLET (5MG  TOTAL) BY MOUTH DAILY  Dispense: 90 tablet; Refill: 1 - losartan (COZAAR) 100 MG tablet; TAKE ONE TABLET (100MG  TOTAL) BY MOUTH DAILY  Dispense: 90 tablet; Refill: 1 - CBC - Comprehensive metabolic panel  2. Mixed hyperlipidemia Due for recheck of lipid profile.  Continue Crestor. - rosuvastatin (CRESTOR) 10 MG tablet; TAKE ONE (1) TABLET BY MOUTH EVERY DAY  Dispense: 90 tablet; Refill: 1 - Lipid panel  3. Obesity (BMI 30.0-34.9) Commended her on making dietary changes.  She is agreeable to seeing a nutritionist.  Encouraged her to get in some form of moderate intensity exercise at least 3 to 5 days a  week for 30 minutes. - Amb ref to Medical Nutrition Therapy-MNT  4. Major depressive disorder, recurrent episode, mild (HCC) Stable on current medications through her psychiatrist.    Patient was given the opportunity to ask questions.  Patient verbalized understanding of the plan and was able to repeat key elements of the plan.   This documentation was completed using Paediatric nurse.  Any transcriptional errors are unintentional.  Orders Placed This Encounter  Procedures   CBC   Comprehensive metabolic panel   Lipid panel   Amb ref to Medical Nutrition Therapy-MNT     Requested Prescriptions   Signed Prescriptions Disp Refills   amLODipine (NORVASC) 5 MG tablet 90 tablet 1    Sig: TAKE ONE TABLET (5MG  TOTAL) BY MOUTH DAILY   losartan (COZAAR) 100 MG tablet 90 tablet 1    Sig: TAKE ONE TABLET (100MG  TOTAL) BY MOUTH DAILY   rosuvastatin (CRESTOR) 10 MG tablet 90 tablet 1    Sig: TAKE ONE (1) TABLET BY MOUTH EVERY DAY    Return in about 4 months (around 02/04/2023).  Jonah Blue, MD, FACP

## 2022-10-05 LAB — COMPREHENSIVE METABOLIC PANEL
ALT: 19 IU/L (ref 0–32)
AST: 18 IU/L (ref 0–40)
Albumin/Globulin Ratio: 1.6 (ref 1.2–2.2)
Albumin: 4.6 g/dL (ref 3.8–4.9)
Alkaline Phosphatase: 73 IU/L (ref 44–121)
BUN/Creatinine Ratio: 10 (ref 9–23)
BUN: 9 mg/dL (ref 6–24)
Bilirubin Total: 0.2 mg/dL (ref 0.0–1.2)
CO2: 24 mmol/L (ref 20–29)
Calcium: 9.6 mg/dL (ref 8.7–10.2)
Chloride: 101 mmol/L (ref 96–106)
Creatinine, Ser: 0.91 mg/dL (ref 0.57–1.00)
Globulin, Total: 2.9 g/dL (ref 1.5–4.5)
Glucose: 92 mg/dL (ref 70–99)
Potassium: 3.8 mmol/L (ref 3.5–5.2)
Sodium: 140 mmol/L (ref 134–144)
Total Protein: 7.5 g/dL (ref 6.0–8.5)
eGFR: 75 mL/min/{1.73_m2} (ref >59)

## 2022-10-05 LAB — CBC
Hematocrit: 39.3 % (ref 34.0–46.6)
Hemoglobin: 13.3 g/dL (ref 11.1–15.9)
MCH: 31.1 pg (ref 26.6–33.0)
MCHC: 33.8 g/dL (ref 31.5–35.7)
MCV: 92 fL (ref 79–97)
Platelets: 318 10*3/uL (ref 150–450)
RBC: 4.27 x10E6/uL (ref 3.77–5.28)
RDW: 12.9 % (ref 11.7–15.4)
WBC: 6.2 10*3/uL (ref 3.4–10.8)

## 2022-10-05 LAB — LIPID PANEL
Chol/HDL Ratio: 3.5 ratio (ref 0.0–4.4)
Cholesterol, Total: 192 mg/dL (ref 100–199)
HDL: 55 mg/dL (ref >39)
LDL Chol Calc (NIH): 117 mg/dL — ABNORMAL HIGH (ref 0–99)
Triglycerides: 110 mg/dL (ref 0–149)
VLDL Cholesterol Cal: 20 mg/dL (ref 5–40)

## 2022-12-03 ENCOUNTER — Ambulatory Visit: Payer: BC Managed Care – PPO | Admitting: Nutrition

## 2022-12-12 DIAGNOSIS — F418 Other specified anxiety disorders: Secondary | ICD-10-CM | POA: Diagnosis not present

## 2022-12-12 DIAGNOSIS — F331 Major depressive disorder, recurrent, moderate: Secondary | ICD-10-CM | POA: Diagnosis not present

## 2022-12-12 DIAGNOSIS — F4322 Adjustment disorder with anxiety: Secondary | ICD-10-CM | POA: Diagnosis not present

## 2023-01-16 ENCOUNTER — Other Ambulatory Visit: Payer: Self-pay | Admitting: Internal Medicine

## 2023-02-05 ENCOUNTER — Ambulatory Visit: Payer: BC Managed Care – PPO | Attending: Internal Medicine | Admitting: Internal Medicine

## 2023-02-05 ENCOUNTER — Encounter: Payer: Self-pay | Admitting: Internal Medicine

## 2023-02-05 VITALS — BP 111/76 | HR 101 | Temp 98.4°F | Ht 67.0 in | Wt 215.0 lb

## 2023-02-05 DIAGNOSIS — R21 Rash and other nonspecific skin eruption: Secondary | ICD-10-CM

## 2023-02-05 DIAGNOSIS — E782 Mixed hyperlipidemia: Secondary | ICD-10-CM

## 2023-02-05 DIAGNOSIS — I10 Essential (primary) hypertension: Secondary | ICD-10-CM

## 2023-02-05 DIAGNOSIS — Z2821 Immunization not carried out because of patient refusal: Secondary | ICD-10-CM

## 2023-02-05 DIAGNOSIS — Z1231 Encounter for screening mammogram for malignant neoplasm of breast: Secondary | ICD-10-CM

## 2023-02-05 DIAGNOSIS — E669 Obesity, unspecified: Secondary | ICD-10-CM | POA: Diagnosis not present

## 2023-02-05 MED ORDER — MOMETASONE FUROATE 0.1 % EX CREA
1.0000 | TOPICAL_CREAM | Freq: Every day | CUTANEOUS | 1 refills | Status: DC | PRN
Start: 2023-02-05 — End: 2024-02-10

## 2023-02-05 NOTE — Progress Notes (Signed)
Patient ID: Shannon Ashley, female    DOB: [birth year]  MRN: [id number]  CC: Hypertension (HTN f/u. /Fine bumps all over limbs, discoloration, itchiness X3-4 weeks/No to flu vax. Yes to mammogram referral. )   Subjective: Shannon Ashley is a 57 y.o. female who presents for chronic ds management. Her concerns today include:  Patient with history of HL, HTN, anxiety disorder, MDD.    HTN:  compliant with meds that includes Cozaar 100 mg daily, Norvasc 5 mg daily and Lozol 2.5 mg.  Limits salt in foods.    HL:  taking and tolerating Crestor.  Last LDL 117 down from 125  Obesity:  referred to nutritionist on last visit.  Missed appt due to death in the family; tried to call and reschedule but no return call. Eating better - lean meat, more salads.  She has cut back on bread intake.  Not exercising as much consistently.  Has a gym membership but not using.    Complains of itchy rash on the extremities.  Had seen gynecology for the same about 2 years ago and was prescribed Elocon 0.1% cream.  About 2 months ago she started experiencing the itching and rash on the arms and legs again.  No initiating factors that she identifies.  She has changed to using baby soap and baby lotion.  She has changed her detergent.  No known food allergies.  She started using the Elocon cream again.  Has just a little left.  It helps some.  She is on Zyrtec. Patient Active Problem List   Diagnosis Date Noted   Postmenopause 02/09/2022   GAD (generalized anxiety disorder) 09/01/2020   Encounter for screening fecal occult blood testing 01/01/2020   Encounter for well woman exam with routine gynecological exam 01/01/2020   Screening for colorectal cancer 07/11/2018   Encounter for gynecological examination with Papanicolaou smear of cervix 07/11/2018   Menopause present, declines hormone replacement therapy 07/11/2018   Major depressive disorder, single episode, mild (HCC) 04/23/2018   Spotting 08/08/2016    Pelvic cramping 08/08/2016   Encounter for routine gynecological examination with Papanicolaou smear of cervix 08/08/2016   Mixed stress and urge urinary incontinence 08/08/2016   Vitamin D deficiency 02/03/2016   History of colonic polyps    Diverticulosis of colon without hemorrhage    Abdominal pain 09/30/2014   Bowel habit changes 09/21/2014   Rectal bleeding 09/21/2014   Hemorrhoids 09/21/2014   Hyperlipidemia 08/31/2014   Anxiety 07/07/2013   Hypertension 07/07/2013   Elevated cholesterol 07/07/2013     Current Outpatient Medications on File Prior to Visit  Medication Sig Dispense Refill   amLODipine (NORVASC) 5 MG tablet TAKE ONE TABLET (5MG  TOTAL) BY MOUTH DAILY 90 tablet 1   cetirizine (ZYRTEC) 10 MG tablet Take 10 mg by mouth daily as needed for allergies.      clonazePAM (KLONOPIN) 0.5 MG tablet Take 0.5 mg by mouth daily as needed.     escitalopram (LEXAPRO) 20 MG tablet TAKE ONE TABLET (20MG  TOTAL) BY MOUTH DAILY 30 tablet 5   fluticasone (FLONASE) 50 MCG/ACT nasal spray USE TWO SPRAYS IN BOTH NOSTRILS DAILY 16 g 2   indapamide (LOZOL) 2.5 MG tablet TAKE ONE TABLET (2.5MG  TOTAL) BY MOUTH DAILY 30 tablet 0   losartan (COZAAR) 100 MG tablet TAKE ONE TABLET (100MG  TOTAL) BY MOUTH DAILY 90 tablet 1   Multiple Vitamin (MULTIVITAMIN) tablet Take 1 tablet by mouth daily.     rosuvastatin (CRESTOR) 10 MG tablet TAKE  ONE (1) TABLET BY MOUTH EVERY DAY 90 tablet 1   traZODone (DESYREL) 50 MG tablet Take 50 mg by mouth at bedtime.     No current facility-administered medications on file prior to visit.    Allergies  Allergen Reactions   Lisinopril Cough    Social History   Socioeconomic History   Marital status: Divorced    Spouse name: Not on file   Number of children: Not on file   Years of education: Not on file   Highest education level: Not on file  Occupational History   Not on file  Tobacco Use   Smoking status: Never   Smokeless tobacco: Never  Vaping Use    Vaping status: Never Used  Substance and Sexual Activity   Alcohol use: Yes    Alcohol/week: 4.0 standard drinks of alcohol    Types: 4 Glasses of wine per week    Comment: occ wine   Drug use: No   Sexual activity: Yes    Birth control/protection: Surgical    Comment: tubal and ablation  Other Topics Concern   Not on file  Social History Narrative   Not on file   Social Determinants of Health   Financial Resource Strain: Medium Risk (02/09/2022)   Overall Financial Resource Strain (CARDIA)    Difficulty of Paying Living Expenses: Somewhat hard  Food Insecurity: Food Insecurity Present (02/09/2022)   Hunger Vital Sign    Worried About Running Out of Food in the Last Year: Sometimes true    Ran Out of Food in the Last Year: Sometimes true  Transportation Needs: No Transportation Needs (02/09/2022)   PRAPARE - Administrator, Civil Service (Medical): No    Lack of Transportation (Non-Medical): No  Physical Activity: Insufficiently Active (02/09/2022)   Exercise Vital Sign    Days of Exercise per Week: 2 days    Minutes of Exercise per Session: 20 min  Stress: Stress Concern Present (02/09/2022)   [employer] of Occupational Health - Occupational Stress Questionnaire    Feeling of Stress : To some extent  Social Connections: Moderately Isolated (02/09/2022)   Social Connection and Isolation Panel [NHANES]    Frequency of Communication with Friends and Family: More than three times a week    Frequency of Social Gatherings with Friends and Family: Once a week    Attends Religious Services: More than 4 times per year    Active Member of [employer] or Organizations: No    Attends Banker Meetings: Never    Marital Status: Divorced  Catering manager Violence: Not At Risk (02/09/2022)   Humiliation, Afraid, Rape, and Kick questionnaire    Fear of Current or Ex-Partner: No    Emotionally Abused: No    Physically Abused: No    Sexually Abused: No    Family  History  Problem Relation Age of Onset   Cancer Mother        lung   Hypertension Mother    Stroke Father    Heart attack Father    Congestive Heart Failure Father    Kidney failure Father    Colon polyps Father    Hypertension Sister    Drug abuse Sister    Diabetes Maternal Uncle    Early death Paternal Aunt    Asthma Paternal Aunt    Cancer Maternal Grandmother        lung   Congestive Heart Failure Paternal Grandmother    Cancer Paternal Grandmother  pancreatic    Past Surgical History:  Procedure Laterality Date   COLONOSCOPY N/A 10/13/2014   Procedure: COLONOSCOPY;  Surgeon: Corbin Ade, MD;  [facility]: [facility];  Service: Endoscopy;  Laterality: N/A;  830    COLONOSCOPY N/A 12/02/2018   Procedure: COLONOSCOPY;  Surgeon: Corbin Ade, MD;  Location: [facility];  Service: Endoscopy;  Laterality: N/A;  8:30am   ECTOPIC PREGNANCY SURGERY     ENDOMETRIAL ABLATION     POLYPECTOMY  12/02/2018   Procedure: POLYPECTOMY;  Surgeon: Corbin Ade, MD;  Location: [facility];  Service: Endoscopy;;  colon   TUBAL LIGATION      ROS: Review of Systems Negative except as stated above  PHYSICAL EXAM: BP 111/76 (BP Location: Left Arm, Patient Position: Sitting, Cuff Size: Large)   Pulse (!) 101   Temp 98.4 F (36.9 C) (Oral)   Ht 5\' 7"  (1.702 m)   Wt 215 lb (97.5 kg)   SpO2 98%   BMI 33.67 kg/m   Wt Readings from Last 3 Encounters:  02/05/23 215 lb (97.5 kg)  10/04/22 210 lb (95.3 kg)  02/09/22 205 lb 8 oz (93.2 kg)    Physical Exam  General appearance - alert, well appearing, and in no distress Mental status - normal mood, behavior, speech, dress, motor activity, and thought processes Chest - clear to auscultation, no wheezes, rales or rhonchi, symmetric air entry Heart - normal rate, regular rhythm, normal S1, S2, no murmurs, rubs, clicks or gallops Extremities - peripheral pulses normal, no pedal edema, no clubbing or cyanosis Skin -patient has  dried areas on both upper and lower extremities.  On the upper extremities around the elbow and forearm, she is noted to have hypopigmented circular patches.      Latest Ref Rng & Units 10/04/2022    2:45 PM 07/21/2021   11:02 AM 09/01/2020    3:00 PM  CMP  Glucose 70 - 99 mg/dL 92  94  81   BUN 6 - 24 mg/dL 9  13  11    Creatinine 0.57 - 1.00 mg/dL 5.40  0.86  7.61   Sodium 134 - 144 mmol/L 140  140  138   Potassium 3.5 - 5.2 mmol/L 3.8  4.2  4.4   Chloride 96 - 106 mmol/L 101  102  99   CO2 20 - 29 mmol/L 24  26  26    Calcium 8.7 - 10.2 mg/dL 9.6  95.0  93.2   Total Protein 6.0 - 8.5 g/dL 7.5  7.6  7.8   Total Bilirubin 0.0 - 1.2 mg/dL 0.2  0.3  0.2   Alkaline Phos 44 - 121 IU/L 73  78  74   AST 0 - 40 IU/L 18  19  24    ALT 0 - 32 IU/L 19  19  24     Lipid Panel     Component Value Date/Time   CHOL 192 10/04/2022 1445   TRIG 110 10/04/2022 1445   HDL 55 10/04/2022 1445   CHOLHDL 3.5 10/04/2022 1445   CHOLHDL 3.5 07/07/2013 0934   VLDL 11 07/07/2013 0934   LDLCALC 117 (H) 10/04/2022 1445    CBC    Component Value Date/Time   WBC 6.2 10/04/2022 1445   WBC 7.3 07/07/2013 0934   RBC 4.27 10/04/2022 1445   RBC 4.02 07/07/2013 0934   HGB 13.3 10/04/2022 1445   HCT 39.3 10/04/2022 1445   PLT 318 10/04/2022 1445   MCV 92  10/04/2022 1445   MCH 31.1 10/04/2022 1445   MCH 32.1 07/07/2013 0934   MCHC 33.8 10/04/2022 1445   MCHC 34.5 07/07/2013 0934   RDW 12.9 10/04/2022 1445    ASSESSMENT AND PLAN: 1. Essential hypertension At goal.  Continue amlodipine 5 mg daily, Cozaar 100 mg daily and Lozol 2.5 mg daily.  2. Mixed hyperlipidemia Continue Crestor 10 mg daily  3. Obesity (BMI 30.0-34.9) Encouraged her to continue to try to eat healthy.  We will resubmit referral to nutritionist.  Encouraged her to try to move more.  She will try going to the gym with some of her coworkers after work a few days a week.  We also discussed medication to help lose weight but looks like  Wegovy and Zepbound are not covered by her insurance. - Amb ref to Medical Nutrition Therapy-MNT  4. Rash and nonspecific skin eruption Questionable allergic type rash.  Refill given on Elocon.  Advised to hold Zyrtec and purchase Claritin over-the-counter instead and take that daily to see if it would make a difference. - mometasone (ELOCON) 0.1 % cream; Apply 1 Application topically daily as needed (Rash).  Dispense: 45 g; Refill: 1 - Ambulatory referral to Allergy  5. Influenza vaccination declined   6. Encounter for screening mammogram for malignant neoplasm of breast - MM Digital Screening; Future     Patient was given the opportunity to ask questions.  Patient verbalized understanding of the plan and was able to repeat key elements of the plan.   This documentation was completed using Paediatric nurse.  Any transcriptional errors are unintentional.  Orders Placed This Encounter  Procedures   MM Digital Screening   Amb ref to Medical Nutrition Therapy-MNT   Ambulatory referral to Allergy     Requested Prescriptions   Signed Prescriptions Disp Refills   mometasone (ELOCON) 0.1 % cream 45 g 1    Sig: Apply 1 Application topically daily as needed (Rash).    Return in about 4 months (around 06/07/2023).  Jonah Blue, MD, FACP

## 2023-02-12 ENCOUNTER — Ambulatory Visit: Payer: BC Managed Care – PPO | Admitting: Dietician

## 2023-03-06 DIAGNOSIS — F418 Other specified anxiety disorders: Secondary | ICD-10-CM | POA: Diagnosis not present

## 2023-03-06 DIAGNOSIS — F4322 Adjustment disorder with anxiety: Secondary | ICD-10-CM | POA: Diagnosis not present

## 2023-03-06 DIAGNOSIS — F331 Major depressive disorder, recurrent, moderate: Secondary | ICD-10-CM | POA: Diagnosis not present

## 2023-03-18 ENCOUNTER — Ambulatory Visit: Payer: BC Managed Care – PPO | Admitting: Dietician

## 2023-05-07 ENCOUNTER — Ambulatory Visit: Payer: BC Managed Care – PPO | Admitting: Nutrition

## 2023-06-10 ENCOUNTER — Ambulatory Visit: Payer: BC Managed Care – PPO | Attending: Internal Medicine | Admitting: Internal Medicine

## 2023-06-10 ENCOUNTER — Encounter: Payer: Self-pay | Admitting: Internal Medicine

## 2023-06-10 VITALS — BP 126/79 | HR 64 | Temp 98.2°F | Ht 67.0 in | Wt 216.0 lb

## 2023-06-10 DIAGNOSIS — G2581 Restless legs syndrome: Secondary | ICD-10-CM

## 2023-06-10 DIAGNOSIS — I10 Essential (primary) hypertension: Secondary | ICD-10-CM | POA: Diagnosis not present

## 2023-06-10 DIAGNOSIS — Z1231 Encounter for screening mammogram for malignant neoplasm of breast: Secondary | ICD-10-CM

## 2023-06-10 DIAGNOSIS — E782 Mixed hyperlipidemia: Secondary | ICD-10-CM | POA: Diagnosis not present

## 2023-06-10 DIAGNOSIS — Z6833 Body mass index (BMI) 33.0-33.9, adult: Secondary | ICD-10-CM

## 2023-06-10 DIAGNOSIS — Z2821 Immunization not carried out because of patient refusal: Secondary | ICD-10-CM

## 2023-06-10 DIAGNOSIS — E669 Obesity, unspecified: Secondary | ICD-10-CM | POA: Diagnosis not present

## 2023-06-10 DIAGNOSIS — E66811 Obesity, class 1: Secondary | ICD-10-CM

## 2023-06-10 MED ORDER — AMLODIPINE BESYLATE 5 MG PO TABS
ORAL_TABLET | ORAL | 1 refills | Status: DC
Start: 2023-06-10 — End: 2024-01-29

## 2023-06-10 MED ORDER — ROSUVASTATIN CALCIUM 10 MG PO TABS
ORAL_TABLET | ORAL | 1 refills | Status: DC
Start: 2023-06-10 — End: 2024-01-29

## 2023-06-10 MED ORDER — LOSARTAN POTASSIUM 100 MG PO TABS
ORAL_TABLET | ORAL | 1 refills | Status: DC
Start: 2023-06-10 — End: 2024-01-29

## 2023-06-10 NOTE — Progress Notes (Signed)
 Patient ID: Shannon Ashley, female    DOB: [birth year]  MRN: [id number]  CC: Hypertension (HTN f/u. /Suspects restless leg syndrome X1 mo/No to flu vax)   Subjective: Shannon Ashley is a 58 y.o. female who presents for chronic ds management. Her concerns today include:  Patient with history of HL, HTN, anxiety disorder, MDD.   Discussed the use of AI scribe software for clinical note transcription with the patient, who gave verbal consent to proceed.  History of Present Illness   The patient, with a history of hypertension and hyperlipidemia, presents with a new complaint of restless leg syndrome. She describes a sensation of her legs 'going to sleep like a tingling' and a need to move them to relieve the symptom, particularly when relaxing or lying in bed. The symptoms are not associated with a 'crawling' sensation.   She denies any changes in her blood pressure or cholesterol medications and reports adherence to a low-salt diet.  Reports compliance with amlodipine  5 mg daily, Cozaar  100 mg daily, Lozol  2.5 mg daily and Crestor  10 mg daily.  Does not check blood pressure.  The patient also expresses frustration with her weight, despite efforts to maintain a healthy diet. She reports difficulty in establishing a regular exercise routine and is considering changing her gym membership to a facility with classes for additional motivation. She also mentions a failed attempt to consult with a nutritionist due to scheduling conflicts.  HM:  In terms of health maintenance, the patient is due for a mammogram and declines both the shingles vaccine and the     Patient Active Problem List   Diagnosis Date Noted   Postmenopause 02/09/2022   GAD (generalized anxiety disorder) 09/01/2020   Encounter for screening fecal occult blood testing 01/01/2020   Encounter for well woman exam with routine gynecological exam 01/01/2020   Screening for colorectal cancer 07/11/2018   Encounter for  gynecological examination with Papanicolaou smear of cervix 07/11/2018   Menopause present, declines hormone replacement therapy 07/11/2018   Major depressive disorder, single episode, mild (HCC) 04/23/2018   Spotting 08/08/2016   Pelvic cramping 08/08/2016   Encounter for routine gynecological examination with Papanicolaou smear of cervix 08/08/2016   Mixed stress and urge urinary incontinence 08/08/2016   Vitamin D  deficiency 02/03/2016   History of colonic polyps    Diverticulosis of colon without hemorrhage    Abdominal pain 09/30/2014   Bowel habit changes 09/21/2014   Rectal bleeding 09/21/2014   Hemorrhoids 09/21/2014   Hyperlipidemia 08/31/2014   Anxiety 07/07/2013   Hypertension 07/07/2013   Elevated cholesterol 07/07/2013     Current Outpatient Medications on File Prior to Visit  Medication Sig Dispense Refill   amLODipine  (NORVASC ) 5 MG tablet TAKE ONE TABLET (5MG  TOTAL) BY MOUTH DAILY 90 tablet 1   cetirizine (ZYRTEC) 10 MG tablet Take 10 mg by mouth daily as needed for allergies.      clonazePAM (KLONOPIN) 0.5 MG tablet Take 0.5 mg by mouth daily as needed.     escitalopram  (LEXAPRO ) 20 MG tablet TAKE ONE TABLET (20MG  TOTAL) BY MOUTH DAILY 30 tablet 5   fluticasone  (FLONASE ) 50 MCG/ACT nasal spray USE TWO SPRAYS IN BOTH NOSTRILS DAILY 16 g 2   indapamide  (LOZOL ) 2.5 MG tablet TAKE ONE TABLET (2.5MG  TOTAL) BY MOUTH DAILY 30 tablet 0   losartan  (COZAAR ) 100 MG tablet TAKE ONE TABLET (100MG  TOTAL) BY MOUTH DAILY 90 tablet 1   mometasone  (ELOCON ) 0.1 % cream Apply 1 Application topically  daily as needed (Rash). 45 g 1   Multiple Vitamin (MULTIVITAMIN) tablet Take 1 tablet by mouth daily.     rosuvastatin  (CRESTOR ) 10 MG tablet TAKE ONE (1) TABLET BY MOUTH EVERY DAY 90 tablet 1   traZODone  (DESYREL ) 50 MG tablet Take 50 mg by mouth at bedtime.     No current facility-administered medications on file prior to visit.    Allergies  Allergen Reactions   Lisinopril  Cough     Social History   Socioeconomic History   Marital status: Divorced    Spouse name: Not on file   Number of children: Not on file   Years of education: Not on file   Highest education level: Not on file  Occupational History   Not on file  Tobacco Use   Smoking status: Never   Smokeless tobacco: Never  Vaping Use   Vaping status: Never Used  Substance and Sexual Activity   Alcohol use: Yes    Alcohol/week: 4.0 standard drinks of alcohol    Types: 4 Glasses of wine per week    Comment: occ wine   Drug use: No   Sexual activity: Yes    Birth control/protection: Surgical    Comment: tubal and ablation  Other Topics Concern   Not on file  Social History Narrative   Not on file   Social Drivers of Health   Financial Resource Strain: Medium Risk (02/09/2022)   Overall Financial Resource Strain (CARDIA)    Difficulty of Paying Living Expenses: Somewhat hard  Food Insecurity: Food Insecurity Present (02/09/2022)   Hunger Vital Sign    Worried About Running Out of Food in the Last Year: Sometimes true    Ran Out of Food in the Last Year: Sometimes true  Transportation Needs: No Transportation Needs (02/09/2022)   PRAPARE - Administrator, Civil Service (Medical): No    Lack of Transportation (Non-Medical): No  Physical Activity: Insufficiently Active (02/09/2022)   Exercise Vital Sign    Days of Exercise per Week: 2 days    Minutes of Exercise per Session: 20 min  Stress: Stress Concern Present (02/09/2022)   [employer] of Occupational Health - Occupational Stress Questionnaire    Feeling of Stress : To some extent  Social Connections: Moderately Isolated (02/09/2022)   Social Connection and Isolation Panel [NHANES]    Frequency of Communication with Friends and Family: More than three times a week    Frequency of Social Gatherings with Friends and Family: Once a week    Attends Religious Services: More than 4 times per year    Active Member of [employer] or  Organizations: No    Attends Banker Meetings: Never    Marital Status: Divorced  Catering Manager Violence: Not At Risk (02/09/2022)   Humiliation, Afraid, Rape, and Kick questionnaire    Fear of Current or Ex-Partner: No    Emotionally Abused: No    Physically Abused: No    Sexually Abused: No    Family History  Problem Relation Age of Onset   Cancer Mother        lung   Hypertension Mother    Stroke Father    Heart attack Father    Congestive Heart Failure Father    Kidney failure Father    Colon polyps Father    Hypertension Sister    Drug abuse Sister    Diabetes Maternal Uncle    Early death Paternal Aunt    Asthma Paternal Aunt  Cancer Maternal Grandmother        lung   Congestive Heart Failure Paternal Grandmother    Cancer Paternal Grandmother        pancreatic    Past Surgical History:  Procedure Laterality Date   COLONOSCOPY N/A 10/13/2014   Procedure: COLONOSCOPY;  Surgeon: Lamar CHRISTELLA Hollingshead, MD;  Location: [facility];  Service: Endoscopy;  Laterality: N/A;  830    COLONOSCOPY N/A 12/02/2018   Procedure: COLONOSCOPY;  Surgeon: Hollingshead Lamar CHRISTELLA, MD;  [facility]: [facility];  Service: Endoscopy;  Laterality: N/A;  8:30am   ECTOPIC PREGNANCY SURGERY     ENDOMETRIAL ABLATION     POLYPECTOMY  12/02/2018   Procedure: POLYPECTOMY;  Surgeon: Hollingshead Lamar CHRISTELLA, MD;  Location: [facility];  Service: Endoscopy;;  colon   TUBAL LIGATION      ROS: Review of Systems Negative except as stated above  PHYSICAL EXAM: BP 126/79 (BP Location: Left Arm, Patient Position: Sitting, Cuff Size: Large)   Pulse 64   Temp 98.2 F (36.8 C) (Oral)   Ht 5' 7 (1.702 m)   Wt 216 lb (98 kg)   SpO2 100%   BMI 33.83 kg/m   Wt Readings from Last 3 Encounters:  06/10/23 216 lb (98 kg)  02/05/23 215 lb (97.5 kg)  10/04/22 210 lb (95.3 kg)    Physical Exam  General appearance - alert, well appearing, middle-age African-American female and in no distress Mental  status - normal mood, behavior, speech, dress, motor activity, and thought processes Neck - supple, no significant adenopathy Chest - clear to auscultation, no wheezes, rales or rhonchi, symmetric air entry Heart - normal rate, regular rhythm, normal S1, S2, no murmurs, rubs, clicks or gallops Extremities - peripheral pulses normal, no pedal edema, no clubbing or cyanosis      Latest Ref Rng & Units 10/04/2022    2:45 PM 07/21/2021   11:02 AM 09/01/2020    3:00 PM  CMP  Glucose 70 - 99 mg/dL 92  94  81   BUN 6 - 24 mg/dL 9  13  11    Creatinine 0.57 - 1.00 mg/dL 9.08  9.11  9.15   Sodium 134 - 144 mmol/L 140  140  138   Potassium 3.5 - 5.2 mmol/L 3.8  4.2  4.4   Chloride 96 - 106 mmol/L 101  102  99   CO2 20 - 29 mmol/L 24  26  26    Calcium  8.7 - 10.2 mg/dL 9.6  89.7  89.9   Total Protein 6.0 - 8.5 g/dL 7.5  7.6  7.8   Total Bilirubin 0.0 - 1.2 mg/dL 0.2  0.3  0.2   Alkaline Phos 44 - 121 IU/L 73  78  74   AST 0 - 40 IU/L 18  19  24    ALT 0 - 32 IU/L 19  19  24     Lipid Panel     Component Value Date/Time   CHOL 192 10/04/2022 1445   TRIG 110 10/04/2022 1445   HDL 55 10/04/2022 1445   CHOLHDL 3.5 10/04/2022 1445   CHOLHDL 3.5 07/07/2013 0934   VLDL 11 07/07/2013 0934   LDLCALC 117 (H) 10/04/2022 1445    CBC    Component Value Date/Time   WBC 6.2 10/04/2022 1445   WBC 7.3 07/07/2013 0934   RBC 4.27 10/04/2022 1445   RBC 4.02 07/07/2013 0934   HGB 13.3 10/04/2022 1445   HCT 39.3 10/04/2022 1445  PLT 318 10/04/2022 1445   MCV 92 10/04/2022 1445   MCH 31.1 10/04/2022 1445   MCH 32.1 07/07/2013 0934   MCHC 33.8 10/04/2022 1445   MCHC 34.5 07/07/2013 0934   RDW 12.9 10/04/2022 1445    ASSESSMENT AND PLAN: 1. Essential hypertension (Primary) At goal.  Continue amlodipine , Cozaar  and Lozol  - amLODipine  (NORVASC ) 5 MG tablet; TAKE ONE TABLET (5MG  TOTAL) BY MOUTH DAILY  Dispense: 90 tablet; Refill: 1 - losartan  (COZAAR ) 100 MG tablet; TAKE ONE TABLET (100MG  TOTAL) BY  MOUTH DAILY  Dispense: 90 tablet; Refill: 1  2. Mixed hyperlipidemia Continue Crestor . - rosuvastatin  (CRESTOR ) 10 MG tablet; TAKE ONE (1) TABLET BY MOUTH EVERY DAY  Dispense: 90 tablet; Refill: 1  3. Restless leg Check CBC to rule out anemia.  Discussed that if this is normal we can try her with Requip or gabapentin.  Went over possible side effects of Requip including compulsive behaviors.  Patient wants to hold off on trying medication - CBC; Future  4. Encounter for screening mammogram for malignant neoplasm of breast Referral submitted for mammogram  5. Obesity (BMI 30.0-34.9) Encouraged her to continue trying to eat healthy.  Weight is stable since last visit.  Encouraged her to get motivated to move more.  Consider purchasing a home exercise equipment like a treadmill since chances of her going to the gym after work is very low.  6. Herpes zoster vaccination declined  Patient was given the opportunity to ask questions.  Patient verbalized understanding of the plan and was able to repeat key elements of the plan.   This documentation was completed using Paediatric nurse.  Any transcriptional errors are unintentional.  No orders of the defined types were placed in this encounter.    Requested Prescriptions    No prescriptions requested or ordered in this encounter    No follow-ups on file.  Barnie Louder, MD, FACP

## 2023-06-13 DIAGNOSIS — F418 Other specified anxiety disorders: Secondary | ICD-10-CM | POA: Diagnosis not present

## 2023-06-13 DIAGNOSIS — F331 Major depressive disorder, recurrent, moderate: Secondary | ICD-10-CM | POA: Diagnosis not present

## 2023-06-13 DIAGNOSIS — F4322 Adjustment disorder with anxiety: Secondary | ICD-10-CM | POA: Diagnosis not present

## 2023-07-03 ENCOUNTER — Other Ambulatory Visit: Payer: Self-pay | Admitting: Internal Medicine

## 2023-09-05 DIAGNOSIS — F4322 Adjustment disorder with anxiety: Secondary | ICD-10-CM | POA: Diagnosis not present

## 2023-09-05 DIAGNOSIS — F331 Major depressive disorder, recurrent, moderate: Secondary | ICD-10-CM | POA: Diagnosis not present

## 2023-09-05 DIAGNOSIS — F418 Other specified anxiety disorders: Secondary | ICD-10-CM | POA: Diagnosis not present

## 2023-10-08 ENCOUNTER — Encounter: Payer: Self-pay | Admitting: Internal Medicine

## 2023-10-08 ENCOUNTER — Ambulatory Visit: Payer: BC Managed Care – PPO | Attending: Internal Medicine | Admitting: Internal Medicine

## 2023-10-08 VITALS — BP 137/91 | HR 61 | Temp 98.1°F | Resp 16 | Ht 67.0 in | Wt 218.0 lb

## 2023-10-08 DIAGNOSIS — E6609 Other obesity due to excess calories: Secondary | ICD-10-CM | POA: Diagnosis not present

## 2023-10-08 DIAGNOSIS — Z1211 Encounter for screening for malignant neoplasm of colon: Secondary | ICD-10-CM

## 2023-10-08 DIAGNOSIS — Z6834 Body mass index (BMI) 34.0-34.9, adult: Secondary | ICD-10-CM

## 2023-10-08 DIAGNOSIS — E782 Mixed hyperlipidemia: Secondary | ICD-10-CM

## 2023-10-08 DIAGNOSIS — I10 Essential (primary) hypertension: Secondary | ICD-10-CM

## 2023-10-08 DIAGNOSIS — E66811 Obesity, class 1: Secondary | ICD-10-CM

## 2023-10-08 DIAGNOSIS — Z1231 Encounter for screening mammogram for malignant neoplasm of breast: Secondary | ICD-10-CM

## 2023-10-08 NOTE — Progress Notes (Signed)
 Patient ID: MAILY EMGE, female    DOB: 11/26/1965  MRN: [id number]  CC: chronic ds management  Subjective: Shannon Ashley is a 58 y.o. female who presents for chronic ds management. Her concerns today include:  Patient with history of HL, HTN, anxiety disorder, MDD.   Discussed the use of AI scribe software for clinical note transcription with the patient, who gave verbal consent to proceed.  History of Present Illness Shannon Ashley "Shannon Ashley" is a 58 year old female with hypertension who presents for a follow-up visit.  She has gained a few lbs since last seen in January, attributing it to stress and lack of regular exercise.  She has been stressed trying to remodel and do some updates to her late aunts house to get it ready to put on the market.  She struggles to attend the gym due to fatigue and time constraints. Her diet includes frequent salads, occasional unhealthy foods, and increased sweet tea consumption, with reduced soft drink intake.  Reports compliance with amlodipine  5 mg daily, Cozaar  100 mg daily, Lozol  2.5 mg daily.  She forgot to take meds this morning when she was leaving out for work.  She has not been consistent in checking her blood pressure but usually monitors it when she suspects elevation. She did not take her blood pressure medication today. Her home blood pressure readings are generally stable with occasional elevations. She is cautious about salt intake and can detect overly salted food. She reports compliance in taking the Crestor  10 mg daily.  She is due for blood test today.  She is awaiting a mammogram appointment and is due for a colon cancer screening in July, following a colonoscopy in July 2020 where polyps were removed.    Patient Active Problem List   Diagnosis Date Noted   Postmenopause 02/09/2022   GAD (generalized anxiety disorder) 09/01/2020   Encounter for screening fecal occult blood testing 01/01/2020   Encounter for well woman exam  with routine gynecological exam 01/01/2020   Screening for colorectal cancer 07/11/2018   Encounter for gynecological examination with Papanicolaou smear of cervix 07/11/2018   Menopause present, declines hormone replacement therapy 07/11/2018   Major depressive disorder, single episode, mild (HCC) 04/23/2018   Spotting 08/08/2016   Pelvic cramping 08/08/2016   Encounter for routine gynecological examination with Papanicolaou smear of cervix 08/08/2016   Mixed stress and urge urinary incontinence 08/08/2016   Vitamin D  deficiency 02/03/2016   History of colonic polyps    Diverticulosis of colon without hemorrhage    Abdominal pain 09/30/2014   Bowel habit changes 09/21/2014   Rectal bleeding 09/21/2014   Hemorrhoids 09/21/2014   Hyperlipidemia 08/31/2014   Anxiety 07/07/2013   Hypertension 07/07/2013   Elevated cholesterol 07/07/2013     Current Outpatient Medications on File Prior to Visit  Medication Sig Dispense Refill   amLODipine  (NORVASC ) 5 MG tablet TAKE ONE TABLET (5MG  TOTAL) BY MOUTH DAILY 90 tablet 1   cetirizine (ZYRTEC) 10 MG tablet Take 10 mg by mouth daily as needed for allergies.      clonazePAM (KLONOPIN) 0.5 MG tablet Take 0.5 mg by mouth daily as needed.     escitalopram  (LEXAPRO ) 20 MG tablet TAKE ONE TABLET (20MG  TOTAL) BY MOUTH DAILY 30 tablet 5   fluticasone  (FLONASE ) 50 MCG/ACT nasal spray USE TWO SPRAYS IN BOTH NOSTRILS DAILY 16 g 2   indapamide  (LOZOL ) 2.5 MG tablet TAKE ONE TABLET (2.5MG  TOTAL) BY MOUTH DAILY 90 tablet 1  losartan  (COZAAR ) 100 MG tablet TAKE ONE TABLET (100MG  TOTAL) BY MOUTH DAILY 90 tablet 1   mometasone  (ELOCON ) 0.1 % cream Apply 1 Application topically daily as needed (Rash). 45 g 1   Multiple Vitamin (MULTIVITAMIN) tablet Take 1 tablet by mouth daily.     rosuvastatin  (CRESTOR ) 10 MG tablet TAKE ONE (1) TABLET BY MOUTH EVERY DAY 90 tablet 1   traZODone  (DESYREL ) 50 MG tablet Take 50 mg by mouth at bedtime.     No current  facility-administered medications on file prior to visit.    Allergies  Allergen Reactions   Lisinopril  Cough    Social History   Socioeconomic History   Marital status: Divorced    Spouse name: Not on file   Number of children: Not on file   Years of education: Not on file   Highest education level: Not on file  Occupational History   Not on file  Tobacco Use   Smoking status: Never   Smokeless tobacco: Never  Vaping Use   Vaping status: Never Used  Substance and Sexual Activity   Alcohol use: Yes    Alcohol/week: 4.0 standard drinks of alcohol    Types: 4 Glasses of wine per week    Comment: occ wine   Drug use: No   Sexual activity: Yes    Birth control/protection: Surgical    Comment: tubal and ablation  Other Topics Concern   Not on file  Social History Narrative   Not on file   Social Drivers of Health   Financial Resource Strain: Medium Risk (06/10/2023)   Overall Financial Resource Strain (CARDIA)    Difficulty of Paying Living Expenses: Somewhat hard  Food Insecurity: No Food Insecurity (06/10/2023)   Hunger Vital Sign    Worried About Running Out of Food in the Last Year: Never true    Ran Out of Food in the Last Year: Never true  Transportation Needs: No Transportation Needs (06/10/2023)   PRAPARE - Administrator, Civil Service (Medical): No    Lack of Transportation (Non-Medical): No  Physical Activity: Insufficiently Active (06/10/2023)   Exercise Vital Sign    Days of Exercise per Week: 2 days    Minutes of Exercise per Session: 30 min  Stress: Stress Concern Present (06/10/2023)   Shannon Ashley    Feeling of Stress : To some extent  Social Connections: Moderately Isolated (06/10/2023)   Social Connection and Isolation Panel [NHANES]    Frequency of Communication with Friends and Family: Never    Frequency of Social Gatherings with Friends and Family: More than three times a  week    Attends Religious Services: More than 4 times per year    Active Member of [employer] or Organizations: No    Attends Banker Meetings: Never    Marital Status: Divorced  Catering manager Violence: Not At Risk (06/10/2023)   Humiliation, Afraid, Rape, and Kick Ashley    Fear of Current or Ex-Partner: No    Emotionally Abused: No    Physically Abused: No    Sexually Abused: No    Family History  Problem Relation Age of Onset   Cancer Mother        lung   Hypertension Mother    Stroke Father    Heart attack Father    Congestive Heart Failure Father    Kidney failure Father    Colon polyps Father    Hypertension Sister  Drug abuse Sister    Diabetes Maternal Uncle    Early death Paternal Aunt    Asthma Paternal Aunt    Cancer Maternal Grandmother        lung   Congestive Heart Failure Paternal Grandmother    Cancer Paternal Grandmother        pancreatic    Past Surgical History:  Procedure Laterality Date   COLONOSCOPY N/A 10/13/2014   Procedure: COLONOSCOPY;  Surgeon: Suzette Espy, MD;  Location: [facility];  Service: Endoscopy;  Laterality: N/A;  830    COLONOSCOPY N/A 12/02/2018   Procedure: COLONOSCOPY;  Surgeon: Suzette Espy, MD;  Location: [facility];  Service: Endoscopy;  Laterality: N/A;  8:30am   ECTOPIC PREGNANCY SURGERY     ENDOMETRIAL ABLATION     POLYPECTOMY  12/02/2018   Procedure: POLYPECTOMY;  Surgeon: Suzette Espy, MD;  Location: [facility];  Service: Endoscopy;;  colon   TUBAL LIGATION      ROS: Review of Systems Negative except as stated above  PHYSICAL EXAM: BP (!) 137/91 (BP Location: Left Arm, Patient Position: Sitting, Cuff Size: Large)   Pulse 61   Temp 98.1 F (36.7 C) (Oral)   Resp 16   Ht 5\' 7"  (1.702 m)   Wt 218 lb (98.9 kg)   SpO2 100%   BMI 34.14 kg/m   Wt Readings from Last 3 Encounters:  10/08/23 218 lb (98.9 kg)  06/10/23 216 lb (98 kg)  02/05/23 215 lb (97.5 kg)    Physical  Exam  General appearance - alert, well appearing, and in no distress Mental status - normal mood, behavior, speech, dress, motor activity, and thought processes Neck - supple, no significant adenopathy Chest - clear to auscultation, no wheezes, rales or rhonchi, symmetric air entry Heart - normal rate, regular rhythm, normal S1, S2, no murmurs, rubs, clicks or gallops Extremities - peripheral pulses normal, no pedal edema, no clubbing or cyanosis      Latest Ref Rng & Units 10/04/2022    2:45 PM 07/21/2021   11:02 AM 09/01/2020    3:00 PM  CMP  Glucose 70 - 99 mg/dL 92  94  81   BUN 6 - 24 mg/dL 9  13  11    Creatinine 0.57 - 1.00 mg/dL 1.61  0.96  0.45   Sodium 134 - 144 mmol/L 140  140  138   Potassium 3.5 - 5.2 mmol/L 3.8  4.2  4.4   Chloride 96 - 106 mmol/L 101  102  99   CO2 20 - 29 mmol/L 24  26  26    Calcium  8.7 - 10.2 mg/dL 9.6  40.9  81.1   Total Protein 6.0 - 8.5 g/dL 7.5  7.6  7.8   Total Bilirubin 0.0 - 1.2 mg/dL 0.2  0.3  0.2   Alkaline Phos 44 - 121 IU/L 73  78  74   AST 0 - 40 IU/L 18  19  24    ALT 0 - 32 IU/L 19  19  24     Lipid Panel     Component Value Date/Time   CHOL 192 10/04/2022 1445   TRIG 110 10/04/2022 1445   HDL 55 10/04/2022 1445   CHOLHDL 3.5 10/04/2022 1445   CHOLHDL 3.5 07/07/2013 0934   VLDL 11 07/07/2013 0934   LDLCALC 117 (H) 10/04/2022 1445    CBC    Component Value Date/Time   WBC 6.2 10/04/2022 1445   WBC 7.3 07/07/2013  0934   RBC 4.27 10/04/2022 1445   RBC 4.02 07/07/2013 0934   HGB 13.3 10/04/2022 1445   HCT 39.3 10/04/2022 1445   PLT 318 10/04/2022 1445   MCV 92 10/04/2022 1445   MCH 31.1 10/04/2022 1445   MCH 32.1 07/07/2013 0934   MCHC 33.8 10/04/2022 1445   MCHC 34.5 07/07/2013 0934   RDW 12.9 10/04/2022 1445    ASSESSMENT AND PLAN: 1. Essential hypertension (Primary) Blood pressure is not at goal but she forgot to take all 3 of her medicines this morning.  She has not checked blood pressure in the last week but  reports prior to that it was running good.  Advised to take her blood pressure medicines when she returns home and continue to monitor with goal being 130/80 or lower. Continue amlodipine , Cozaar  and Lozol   - CBC - Comprehensive metabolic panel with GFR  2. Mixed hyperlipidemia Continue Crestor  10 mg daily. - Lipid panel  3. Class 1 obesity due to excess calories with serious comorbidity and body mass index (BMI) of 34.0 to 34.9 in adult Weight gain concerns despite activity. Stress and dietary habits may contribute. - Consider purchasing a treadmill or stationary bike for home use. - Use low-calorie salad dressings. - Reduce sweet tea consumption by mixing with unsweetened tea. - Consider using zero-calorie sweeteners for beverages like lemonade.   4. Encounter for screening mammogram for malignant neoplasm of breast - MM 3D SCREENING MAMMOGRAM BILATERAL BREAST; Future  5. Screening for colon cancer Referred to [facility]  Assessment and Plan  Patient was given the opportunity to ask questions.  Patient verbalized understanding of the plan and was able to repeat key elements of the plan.   This documentation was completed using Paediatric nurse.  Any transcriptional errors are unintentional.  Orders Placed This Encounter  Procedures   CBC   Comprehensive metabolic panel with GFR   Lipid panel     Requested Prescriptions    No prescriptions requested or ordered in this encounter    No follow-ups on file.  Concetta Dee, MD, FACP

## 2023-10-08 NOTE — Patient Instructions (Signed)
 VISIT SUMMARY:  Today, we discussed your recent weight gain, blood pressure management, and general health maintenance. We reviewed your current medications and lifestyle habits, and made some recommendations to help you manage your health more effectively.  YOUR PLAN:  -HYPERTENSION: Hypertension means high blood pressure. Your blood pressure was slightly elevated today, likely because you missed your medication. Please recheck your blood pressure regularly at home, take your medications daily, and continue to limit your salt intake.  -OBESITY: Obesity means having excess body weight. Your recent weight gain may be due to stress and dietary habits. Consider getting a treadmill or stationary bike for home exercise, use low-calorie salad dressings, reduce sweet tea consumption by mixing it with unsweetened tea, and try zero-calorie sweeteners for drinks like lemonade.  -HYPERLIPIDEMIA: Hyperlipidemia means having high cholesterol levels. Continue taking your rosuvastatin  as prescribed to manage your cholesterol.  -GENERAL HEALTH MAINTENANCE: You are due for a mammogram and a colon cancer screening. We will resubmit the mammogram order to [facility] and submit a referral for a colonoscopy to [facility] gastroenterology.  INSTRUCTIONS:  Please recheck your blood pressure regularly at home and ensure you take your amlodipine  5 mg daily. We will resubmit the mammogram order to [facility] and submit a referral for a colonoscopy to [facility] gastroenterology.

## 2023-10-09 ENCOUNTER — Ambulatory Visit: Payer: Self-pay | Admitting: Internal Medicine

## 2023-10-09 ENCOUNTER — Encounter: Payer: Self-pay | Admitting: *Deleted

## 2023-10-09 LAB — COMPREHENSIVE METABOLIC PANEL WITH GFR
ALT: 27 IU/L (ref 0–32)
AST: 19 IU/L (ref 0–40)
Albumin: 4.6 g/dL (ref 3.8–4.9)
Alkaline Phosphatase: 80 IU/L (ref 44–121)
BUN/Creatinine Ratio: 15 (ref 9–23)
BUN: 15 mg/dL (ref 6–24)
Bilirubin Total: 0.2 mg/dL (ref 0.0–1.2)
CO2: 17 mmol/L — ABNORMAL LOW (ref 20–29)
Calcium: 9.6 mg/dL (ref 8.7–10.2)
Chloride: 103 mmol/L (ref 96–106)
Creatinine, Ser: 0.98 mg/dL (ref 0.57–1.00)
Globulin, Total: 3 g/dL (ref 1.5–4.5)
Glucose: 80 mg/dL (ref 70–99)
Potassium: 4.6 mmol/L (ref 3.5–5.2)
Sodium: 139 mmol/L (ref 134–144)
Total Protein: 7.6 g/dL (ref 6.0–8.5)
eGFR: 68 mL/min/{1.73_m2} (ref >59)

## 2023-10-09 LAB — CBC
Hematocrit: 41.7 % (ref 34.0–46.6)
Hemoglobin: 13.2 g/dL (ref 11.1–15.9)
MCH: 30.4 pg (ref 26.6–33.0)
MCHC: 31.7 g/dL (ref 31.5–35.7)
MCV: 96 fL (ref 79–97)
Platelets: 353 10*3/uL (ref 150–450)
RBC: 4.34 x10E6/uL (ref 3.77–5.28)
RDW: 13.1 % (ref 11.7–15.4)
WBC: 6.3 10*3/uL (ref 3.4–10.8)

## 2023-10-09 LAB — LIPID PANEL
Chol/HDL Ratio: 4.3 ratio (ref 0.0–4.4)
Cholesterol, Total: 218 mg/dL — ABNORMAL HIGH (ref 100–199)
HDL: 51 mg/dL (ref >39)
LDL Chol Calc (NIH): 147 mg/dL — ABNORMAL HIGH (ref 0–99)
Triglycerides: 113 mg/dL (ref 0–149)
VLDL Cholesterol Cal: 20 mg/dL (ref 5–40)

## 2023-10-31 ENCOUNTER — Encounter (INDEPENDENT_AMBULATORY_CARE_PROVIDER_SITE_OTHER): Payer: Self-pay | Admitting: *Deleted

## 2023-12-02 DIAGNOSIS — F331 Major depressive disorder, recurrent, moderate: Secondary | ICD-10-CM | POA: Diagnosis not present

## 2023-12-02 DIAGNOSIS — F418 Other specified anxiety disorders: Secondary | ICD-10-CM | POA: Diagnosis not present

## 2023-12-02 DIAGNOSIS — F4322 Adjustment disorder with anxiety: Secondary | ICD-10-CM | POA: Diagnosis not present

## 2023-12-18 ENCOUNTER — Ambulatory Visit
Admission: RE | Admit: 2023-12-18 | Discharge: 2023-12-18 | Disposition: A | Discharge status: Home / Self Care | Source: Ambulatory Visit | Attending: Internal Medicine | Admitting: Internal Medicine

## 2023-12-18 DIAGNOSIS — Z1231 Encounter for screening mammogram for malignant neoplasm of breast: Secondary | ICD-10-CM | POA: Diagnosis not present

## 2024-01-28 ENCOUNTER — Other Ambulatory Visit: Payer: Self-pay | Admitting: Internal Medicine

## 2024-01-28 DIAGNOSIS — I10 Essential (primary) hypertension: Secondary | ICD-10-CM

## 2024-01-28 DIAGNOSIS — E782 Mixed hyperlipidemia: Secondary | ICD-10-CM

## 2024-02-10 ENCOUNTER — Encounter: Payer: Self-pay | Admitting: Internal Medicine

## 2024-02-10 ENCOUNTER — Ambulatory Visit: Attending: Internal Medicine | Admitting: Internal Medicine

## 2024-02-10 VITALS — BP 104/69 | HR 87 | Ht 67.0 in | Wt 214.0 lb

## 2024-02-10 DIAGNOSIS — I10 Essential (primary) hypertension: Secondary | ICD-10-CM

## 2024-02-10 DIAGNOSIS — Z1211 Encounter for screening for malignant neoplasm of colon: Secondary | ICD-10-CM

## 2024-02-10 DIAGNOSIS — Z79899 Other long term (current) drug therapy: Secondary | ICD-10-CM

## 2024-02-10 DIAGNOSIS — Z6835 Body mass index (BMI) 35.0-35.9, adult: Secondary | ICD-10-CM

## 2024-02-10 DIAGNOSIS — E66811 Obesity, class 1: Secondary | ICD-10-CM | POA: Diagnosis not present

## 2024-02-10 DIAGNOSIS — E785 Hyperlipidemia, unspecified: Secondary | ICD-10-CM | POA: Diagnosis not present

## 2024-02-10 DIAGNOSIS — F419 Anxiety disorder, unspecified: Secondary | ICD-10-CM

## 2024-02-10 DIAGNOSIS — Z2821 Immunization not carried out because of patient refusal: Secondary | ICD-10-CM

## 2024-02-10 DIAGNOSIS — F330 Major depressive disorder, recurrent, mild: Secondary | ICD-10-CM

## 2024-02-10 DIAGNOSIS — J069 Acute upper respiratory infection, unspecified: Secondary | ICD-10-CM

## 2024-02-10 DIAGNOSIS — R21 Rash and other nonspecific skin eruption: Secondary | ICD-10-CM

## 2024-02-10 MED ORDER — MOMETASONE FUROATE 0.1 % EX CREA: 1.0000 | TOPICAL_CREAM | Freq: Every day | CUTANEOUS | 1 refills | Status: AC | PRN

## 2024-02-10 NOTE — Progress Notes (Signed)
 Patient ID: Shannon Ashley, female    DOB: [birth year]  MRN: [id number]  CC: Hypertension (HTN f/u. /On-going rash on both hands & bilateral feet /Congestion, cough, post nasal drip X2 days/No to flu & pneumonia vax.)   Subjective: Shannon Ashley is a 58 y.o. female who presents for chronic ds management. Her concerns today include: Patient with history of HL, HTN, anxiety disorder, MDD, obesity  Congestion/cough rhinorrhea:  Reports tickle in back of throat with wet cough w/out production of sputum that started yesterday. Feels like it could be related to allergies. Reports post nasal drip, yellow rhinorrhea, sinus and ear congestion. Sx improved with Mucinex. Denies sore throat, fever, or facial pain. Denies sick contacts, but had recent travel to [employer] in [place] .   Hand and Foot Rash: Dorsal surfaces. Intermittently reoccurs for the past several years. Ran out of mometasone  0.1% cream which helps the sx on her hands. Also reports pruritus in the feet and legs. Curious if she needs to see a dermatologist again. She keeps her skin well hydrated w/ lotion. Has tried different OTC meds w/o relief.   MDD/Anxiety: Reports sx well controlled on Lexapro  20 mg daily and Klonopin 0.5 mg daily PRN. Takes Trazadone 50mg  PRN for sleep.    02/10/2024    4:06 PM 10/08/2023    4:41 PM 06/10/2023    4:54 PM  PHQ9 SCORE ONLY  PHQ-9 Total Score 2 2  3       Data saved with a previous flowsheet row definition   HTN:  BP 104/69 Is taking indapamide  2.5 mg daily, losartan  100  mg daily. Reports decent compliance with meds, some days can forget. The patient took their BP pills this morning. Checks BP at home and reports it runs 120/73 systolic this past week. Daughter is a Engineer, civil (consulting) and helps her check her BP. The patient is limiting salt in diet. Exercising every now and then.   Obesity:  Weight is stable. Lost 2 pounds since 05/2023.  Cut off late night eating, and avoiding sugary snacks. Will  rarely drink a sweet tea, but mostly drinks water .  HL:  Last Lipid Panel results: Lipid Panel     Component Value Date/Time   CHOL 218 (H) 10/08/2023 1626   TRIG 113 10/08/2023 1626   HDL 51 10/08/2023 1626   CHOLHDL 4.3 10/08/2023 1626   CHOLHDL 3.5 07/07/2013 0934   VLDL 11 07/07/2013 0934   LDLCALC 147 (H) 10/08/2023 1626   LABVLDL 20 10/08/2023 1626   The patient is still on rosuvastatin  10 mg daily for cholesterol management and reports good adherence since last lipid profile was checked.     Family hx of kidney disease: Pt was concerned about her renal function, reports her father died of kidney disease. Wanted to know more about most recent kidney fxn labs.  HM:  Hepatitis B Vaccines 19-59 - got when she was a child Colonoscopy due on 12/02/2023- will call and schedule this appt Declined Influenza Vaccine    Patient Active Problem List   Diagnosis Date Noted   Postmenopause 02/09/2022   GAD (generalized anxiety disorder) 09/01/2020   Encounter for screening fecal occult blood testing 01/01/2020   Encounter for well woman exam with routine gynecological exam 01/01/2020   Screening for colorectal cancer 07/11/2018   Encounter for gynecological examination with Papanicolaou smear of cervix 07/11/2018   Menopause present, declines hormone replacement therapy 07/11/2018   Major depressive disorder, single episode, mild (HCC) 04/23/2018   Spotting  08/08/2016   Pelvic cramping 08/08/2016   Encounter for routine gynecological examination with Papanicolaou smear of cervix 08/08/2016   Mixed stress and urge urinary incontinence 08/08/2016   Vitamin D  deficiency 02/03/2016   History of colonic polyps    Diverticulosis of colon without hemorrhage    Abdominal pain 09/30/2014   Bowel habit changes 09/21/2014   Rectal bleeding 09/21/2014   Hemorrhoids 09/21/2014   Hyperlipidemia 08/31/2014   Anxiety 07/07/2013   Hypertension 07/07/2013   Elevated cholesterol 07/07/2013      Current Outpatient Medications on File Prior to Visit  Medication Sig Dispense Refill   amLODipine  (NORVASC ) 5 MG tablet TAKE ONE TABLET (5MG  TOTAL) BY MOUTH DAILY 90 tablet 1   cetirizine (ZYRTEC) 10 MG tablet Take 10 mg by mouth daily as needed for allergies.      clonazePAM (KLONOPIN) 0.5 MG tablet Take 0.5 mg by mouth daily as needed.     escitalopram  (LEXAPRO ) 20 MG tablet TAKE ONE TABLET (20MG  TOTAL) BY MOUTH DAILY 30 tablet 5   fluticasone  (FLONASE ) 50 MCG/ACT nasal spray USE TWO SPRAYS IN BOTH NOSTRILS DAILY 16 g 2   indapamide  (LOZOL ) 2.5 MG tablet TAKE ONE TABLET (2.5MG  TOTAL) BY MOUTH DAILY 90 tablet 1   losartan  (COZAAR ) 100 MG tablet TAKE ONE TABLET (100MG  TOTAL) BY MOUTH DAILY 90 tablet 1   Multiple Vitamin (MULTIVITAMIN) tablet Take 1 tablet by mouth daily.     rosuvastatin  (CRESTOR ) 10 MG tablet TAKE ONE (1) TABLET BY MOUTH EVERY DAY 90 tablet 1   traZODone  (DESYREL ) 50 MG tablet Take 50 mg by mouth at bedtime.     No current facility-administered medications on file prior to visit.    Allergies  Allergen Reactions   Lisinopril  Cough    Social History   Socioeconomic History   Marital status: Divorced    Spouse name: Not on file   Number of children: Not on file   Years of education: Not on file   Highest education level: Not on file  Occupational History   Not on file  Tobacco Use   Smoking status: Never   Smokeless tobacco: Never  Vaping Use   Vaping status: Never Used  Substance and Sexual Activity   Alcohol use: Yes    Alcohol/week: 4.0 standard drinks of alcohol    Types: 4 Glasses of wine per week    Comment: occ wine   Drug use: No   Sexual activity: Yes    Birth control/protection: Surgical    Comment: tubal and ablation  Other Topics Concern   Not on file  Social History Narrative   Not on file   Social Drivers of Health   Financial Resource Strain: Medium Risk (06/10/2023)   Overall Financial Resource Strain (CARDIA)    Difficulty of  Paying Living Expenses: Somewhat hard  Food Insecurity: No Food Insecurity (06/10/2023)   Hunger Vital Sign    Worried About Running Out of Food in the Last Year: Never true    Ran Out of Food in the Last Year: Never true  Transportation Needs: No Transportation Needs (06/10/2023)   PRAPARE - Administrator, Civil Service (Medical): No    Lack of Transportation (Non-Medical): No  Physical Activity: Insufficiently Active (06/10/2023)   Exercise Vital Sign    Days of Exercise per Week: 2 days    Minutes of Exercise per Session: 30 min  Stress: Stress Concern Present (06/10/2023)   [employer] of Occupational Health - Occupational Stress  Questionnaire    Feeling of Stress : To some extent  Social Connections: Moderately Isolated (06/10/2023)   Social Connection and Isolation Panel    Frequency of Communication with Friends and Family: Never    Frequency of Social Gatherings with Friends and Family: More than three times a week    Attends Religious Services: More than 4 times per year    Active Member of [employer] or Organizations: No    Attends Banker Meetings: Never    Marital Status: Divorced  Catering manager Violence: Not At Risk (06/10/2023)   Humiliation, Afraid, Rape, and Kick questionnaire    Fear of Current or Ex-Partner: No    Emotionally Abused: No    Physically Abused: No    Sexually Abused: No    Family History  Problem Relation Age of Onset   Cancer Mother        lung   Hypertension Mother    Stroke Father    Heart attack Father    Congestive Heart Failure Father    Kidney failure Father    Colon polyps Father    Hypertension Sister    Drug abuse Sister    Diabetes Maternal Uncle    Early death Paternal Aunt    Asthma Paternal Aunt    Cancer Maternal Grandmother        lung   Congestive Heart Failure Paternal Grandmother    Cancer Paternal Grandmother        pancreatic    Past Surgical History:  Procedure Laterality Date    COLONOSCOPY N/A 10/13/2014   Procedure: COLONOSCOPY;  Surgeon: Lamar CHRISTELLA Hollingshead, MD;  [facility];  Service: Endoscopy;  Laterality: N/A;  830    COLONOSCOPY N/A 12/02/2018   Procedure: COLONOSCOPY;  Surgeon: Hollingshead Lamar CHRISTELLA, MD;  [facility]: [facility];  Service: Endoscopy;  Laterality: N/A;  8:30am   ECTOPIC PREGNANCY SURGERY     ENDOMETRIAL ABLATION     POLYPECTOMY  12/02/2018   Procedure: POLYPECTOMY;  Surgeon: Hollingshead Lamar CHRISTELLA, MD;  Location: [facility];  Service: Endoscopy;;  colon   TUBAL LIGATION      ROS: Review of Systems Negative except as stated above  PHYSICAL EXAM: BP 104/69 (BP Location: Left Arm, Patient Position: Sitting, Cuff Size: Large)   Pulse 87   Ht 5' 7 (1.702 m)   Wt 214 lb (97.1 kg)   SpO2 97%   BMI 33.52 kg/m   Physical Exam  General appearance - alert, well appearing, and in no distress Mental status - normal mood, behavior, speech, dress, motor activity, and thought processes Mouth - mucous membranes moist, pharynx normal without lesions no erythema, tonsills nonenlarged Neck - supple, no significant adenopathy Chest - clear to auscultation, no wheezes, rales or rhonchi, symmetric air entry Heart - normal rate, regular rhythm, normal S1, S2, no murmurs, rubs, clicks or gallops Extremities - no pedal edema noted Skin - hypopigmented plaques on the dorsal surfaces of the hand and RT foot      Latest Ref Rng & Units 10/08/2023    4:26 PM 10/04/2022    2:45 PM 07/21/2021   11:02 AM  CMP  Glucose 70 - 99 mg/dL 80  92  94   BUN 6 - 24 mg/dL 15  9  13    Creatinine 0.57 - 1.00 mg/dL 9.01  9.08  9.11   Sodium 134 - 144 mmol/L 139  140  140   Potassium 3.5 - 5.2 mmol/L 4.6  3.8  4.2   Chloride 96 - 106 mmol/L 103  101  102   CO2 20 - 29 mmol/L 17  24  26    Calcium  8.7 - 10.2 mg/dL 9.6  9.6  89.7   Total Protein 6.0 - 8.5 g/dL 7.6  7.5  7.6   Total Bilirubin 0.0 - 1.2 mg/dL 0.2  0.2  0.3   Alkaline Phos 44 - 121 IU/L 80  73  78   AST 0 - 40  IU/L 19  18  19    ALT 0 - 32 IU/L 27  19  19     Lipid Panel     Component Value Date/Time   CHOL 218 (H) 10/08/2023 1626   TRIG 113 10/08/2023 1626   HDL 51 10/08/2023 1626   CHOLHDL 4.3 10/08/2023 1626   CHOLHDL 3.5 07/07/2013 0934   VLDL 11 07/07/2013 0934   LDLCALC 147 (H) 10/08/2023 1626    CBC    Component Value Date/Time   WBC 6.3 10/08/2023 1626   WBC 7.3 07/07/2013 0934   RBC 4.34 10/08/2023 1626   RBC 4.02 07/07/2013 0934   HGB 13.2 10/08/2023 1626   HCT 41.7 10/08/2023 1626   PLT 353 10/08/2023 1626   MCV 96 10/08/2023 1626   MCH 30.4 10/08/2023 1626   MCH 32.1 07/07/2013 0934   MCHC 31.7 10/08/2023 1626   MCHC 34.5 07/07/2013 0934   RDW 13.1 10/08/2023 1626    ASSESSMENT AND PLAN:  Assessment and Plan  1. Viral URI Sx most suspicious for acute viral illness. Exam reassuring at this time. Treat symptomatically, recommend Coricidin HBP OTC. - Novel Coronavirus, NAA ([facility])  2. Essential hypertension At goal. Continue indapamide  2.5 mg daily, losartan  100 mg daily. Discussed goals for exercise include 150 min per week which comes out to 30 mins 5 times a week of exercise. Advised its easier to start exercising 1-2x per week and increasing activity from there.  3. Obesity (BMI 30.0-34.9) Weight stable. Discussed eating healthier and avoiding sugar snacks. - Exercise goals as above.  4. Hyperlipidemia, unspecified hyperlipidemia type (Primary) Continue rosuvastatin    5. Major depressive disorder, recurrent episode, mild (HCC) Well controlled.  - Continue Lexapro  20 mg daily   6. Anxiety Well controlled. - Continue Klonopin 0.5 mg daily   7. Rash and nonspecific skin eruption - Advised Curel and Palmar cocoa butter for hands and feet. - Referal to Dermatology - mometasone  (ELOCON ) 0.1 % cream refill   8. Screening for colon cancer Pt forgot to call and schedule her colonoscopy but will do that today.  9. Influenza vaccination declined  10.  Herpes zoster vaccination declined  11. Kidney function Most recent GFR 68 with previous 2 in the 70s and Cr wnl. Advised her to continue to drink several glasses water  daily. Avoid long term use of OTC NSAIDS as these can worsen kidney function.   Patient was given the opportunity to ask questions.  Patient verbalized understanding of the plan and was able to repeat key elements of the plan.    Orders Placed This Encounter  Procedures   Novel Coronavirus, NAA ([facility])   Ambulatory referral to Dermatology     Requested Prescriptions   Signed Prescriptions Disp Refills   mometasone  (ELOCON ) 0.1 % cream 45 g 1    Sig: Apply 1 Application topically daily as needed (Rash).    Return in about 4 months (around 06/11/2024).  This is a Psychologist, occupational Note.  The care of the patient  was discussed with Dr. Vicci and the assessment and plan formulated with her assistance.  Please see her attestation of this encounter.  Duwaine Amber, MS3 [employer]  Evaluation and management procedures were performed by me with Medical Student in attendance, note written by Medical Student under my supervision and collaboration. I have reviewed the note and I agree with the management and plan.  Barnie Vicci, MD, FAAFP. [facility] [place], [place] [phone]   02/10/2024, 6:15 PM

## 2024-02-10 NOTE — Patient Instructions (Addendum)
 Your kidney function looks good at this time. Drink lots of water . Avoid ibuprofen, Aleve, advil. These can worsen kidney function.  I have sent a referral to dermatology for your reoccurring skin rash.  Please call to have your colonoscopy scheduled.

## 2024-02-12 ENCOUNTER — Ambulatory Visit: Payer: Self-pay | Admitting: Internal Medicine

## 2024-02-12 LAB — NOVEL CORONAVIRUS, NAA: SARS-CoV-2, NAA: DETECTED — AB

## 2024-02-12 NOTE — Telephone Encounter (Signed)
 Phone call placed to patient this morning to go over lab results.  She tested positive for COVID.  Patient states she is feeling better compared to when she saw me 2 days ago.  She still has a slight cough and feels a little tired.  No fever.  Advised patient that she is still within the 5-day window where we can prescribe Paxlovid.  However if she is feeling better we can hold off on doing so.  I went over potential side effects of Paxlovid including change in taste and diarrhea.  Advised that we would need to have her hold the rosuvastatin  while on Paxlovid.  We agreed that we will hold off on prescribing the Paxlovid since she is doing better.  She inquired about when she can return to work.  I told her that as long as she is without fever for 24 hours and cough is improving, she can return.  I advised that she returns to work on 02/14/24. Continue to wear mask around household members and others until cough resolves. Call back if any worsening of symptoms.

## 2024-02-24 DIAGNOSIS — F4322 Adjustment disorder with anxiety: Secondary | ICD-10-CM | POA: Diagnosis not present

## 2024-02-24 DIAGNOSIS — F331 Major depressive disorder, recurrent, moderate: Secondary | ICD-10-CM | POA: Diagnosis not present

## 2024-02-24 DIAGNOSIS — F418 Other specified anxiety disorders: Secondary | ICD-10-CM | POA: Diagnosis not present

## 2024-04-16 DIAGNOSIS — L308 Other specified dermatitis: Secondary | ICD-10-CM | POA: Diagnosis not present

## 2024-05-18 DIAGNOSIS — F4322 Adjustment disorder with anxiety: Secondary | ICD-10-CM | POA: Diagnosis not present

## 2024-05-18 DIAGNOSIS — F331 Major depressive disorder, recurrent, moderate: Secondary | ICD-10-CM | POA: Diagnosis not present

## 2024-05-18 DIAGNOSIS — F418 Other specified anxiety disorders: Secondary | ICD-10-CM | POA: Diagnosis not present

## 2024-06-12 ENCOUNTER — Ambulatory Visit: Admitting: Internal Medicine

## 2024-07-01 ENCOUNTER — Telehealth: Payer: Self-pay | Admitting: Internal Medicine

## 2024-07-01 ENCOUNTER — Other Ambulatory Visit: Payer: Self-pay | Admitting: Internal Medicine

## 2024-07-01 DIAGNOSIS — Z1211 Encounter for screening for malignant neoplasm of colon: Secondary | ICD-10-CM

## 2024-07-01 DIAGNOSIS — Z860101 Personal history of adenomatous and serrated colon polyps: Secondary | ICD-10-CM

## 2024-07-01 NOTE — Telephone Encounter (Signed)
 Copied from CRM #[id number]. Topic: Referral - Request for Referral >> Jul 01, 2024  4:49 PM.   Kevelyn M wrote:  Did the patient discuss referral with their provider in the last year? Yes Patient needs a new referral  Appointment offered? No  Type of order/referral and detailed reason for visit: GI Doctor  Preference of office, provider, location: [facility]  If referral order, have you been seen by this specialty before? Yes (If Yes, this issue or another issue? When? Where?  Can we respond through MyChart? No

## 2024-07-02 NOTE — Addendum Note (Signed)
 Addended by: VICCI SOBER B on: 07/02/2024 12:43 PM   Modules accepted: Orders

## 2024-07-02 NOTE — Telephone Encounter (Signed)
 GI referral has been submitted. Thanks.

## 2024-07-02 NOTE — Telephone Encounter (Signed)
 Called & spoke to the patient. Verified name & DOB. Inquired reasoning for GI referral. Patient stated that she is ready to do her colonoscopy. She was previously unable to do so due to life circumstances at that time but would like to try again.

## 2024-07-02 NOTE — Telephone Encounter (Signed)
Called but no answer. Unable to LVM due to VM being full.

## 2024-07-03 NOTE — Telephone Encounter (Signed)
Called but no answer. Unable to LVM due to VM being full.

## 2024-07-13 ENCOUNTER — Ambulatory Visit: Admitting: Internal Medicine
